# Patient Record
Sex: Male | Born: 1940 | Race: White | Hispanic: No | Marital: Married | Smoking: Never smoker
Health system: Southern US, Community
[De-identification: ages and names within clinical notes are randomized; demographics above are authoritative.]

## PROBLEM LIST (undated history)

## (undated) DIAGNOSIS — J181 Lobar pneumonia, unspecified organism: Secondary | ICD-10-CM

## (undated) DIAGNOSIS — E785 Hyperlipidemia, unspecified: Secondary | ICD-10-CM

## (undated) DIAGNOSIS — N179 Acute kidney failure, unspecified: Secondary | ICD-10-CM

## (undated) DIAGNOSIS — R0902 Hypoxemia: Secondary | ICD-10-CM

## (undated) DIAGNOSIS — Z79899 Other long term (current) drug therapy: Secondary | ICD-10-CM

## (undated) DIAGNOSIS — Z951 Presence of aortocoronary bypass graft: Secondary | ICD-10-CM

## (undated) DIAGNOSIS — I110 Hypertensive heart disease with heart failure: Secondary | ICD-10-CM

## (undated) DIAGNOSIS — I5032 Chronic diastolic (congestive) heart failure: Secondary | ICD-10-CM

## (undated) DIAGNOSIS — I10 Essential (primary) hypertension: Secondary | ICD-10-CM

## (undated) DIAGNOSIS — I2510 Atherosclerotic heart disease of native coronary artery without angina pectoris: Secondary | ICD-10-CM

## (undated) DIAGNOSIS — I4892 Unspecified atrial flutter: Secondary | ICD-10-CM

## (undated) DIAGNOSIS — E872 Acidosis: Secondary | ICD-10-CM

## (undated) DIAGNOSIS — J449 Chronic obstructive pulmonary disease, unspecified: Secondary | ICD-10-CM

## (undated) DIAGNOSIS — I480 Paroxysmal atrial fibrillation: Secondary | ICD-10-CM

## (undated) DIAGNOSIS — E119 Type 2 diabetes mellitus without complications: Secondary | ICD-10-CM

## (undated) HISTORY — DX: Paroxysmal atrial fibrillation: I48.0

## (undated) HISTORY — DX: Unspecified atrial flutter: I48.92

## (undated) HISTORY — DX: Acidosis: E87.2

## (undated) HISTORY — DX: Hypertensive heart disease with heart failure: I11.0

## (undated) HISTORY — DX: Atherosclerotic heart disease of native coronary artery without angina pectoris: I25.10

## (undated) HISTORY — DX: Acute kidney failure, unspecified: N17.9

## (undated) HISTORY — DX: Type 2 diabetes mellitus without complications: E11.9

## (undated) HISTORY — PX: ELECTROPHYSIOLOGIC STUDY: SHX172A

## (undated) HISTORY — DX: Chronic obstructive pulmonary disease, unspecified: J44.9

## (undated) HISTORY — PX: TOTAL HIP REVISION: SHX763

## (undated) HISTORY — PX: CORONARY ARTERY BYPASS GRAFT: SHX141

## (undated) HISTORY — DX: Lobar pneumonia, unspecified organism: J18.1

## (undated) HISTORY — DX: Presence of aortocoronary bypass graft: Z95.1

## (undated) HISTORY — DX: Other long term (current) drug therapy: Z79.899

## (undated) HISTORY — DX: Hypoxemia: R09.02

## (undated) HISTORY — PX: BACK SURGERY: SHX140

## (undated) HISTORY — DX: Chronic diastolic (congestive) heart failure: I50.32

## (undated) HISTORY — DX: Essential (primary) hypertension: I10

## (undated) HISTORY — DX: Hyperlipidemia, unspecified: E78.5

---

## 2008-03-19 ENCOUNTER — Ambulatory Visit: Payer: Self-pay | Admitting: Thoracic Surgery (Cardiothoracic Vascular Surgery)

## 2008-04-30 ENCOUNTER — Ambulatory Visit: Payer: Self-pay | Admitting: Thoracic Surgery (Cardiothoracic Vascular Surgery)

## 2014-03-21 ENCOUNTER — Other Ambulatory Visit: Payer: Self-pay | Admitting: Orthopaedic Surgery

## 2014-03-21 DIAGNOSIS — M545 Low back pain, unspecified: Secondary | ICD-10-CM

## 2014-03-26 ENCOUNTER — Ambulatory Visit
Admission: RE | Admit: 2014-03-26 | Discharge: 2014-03-26 | Disposition: A | Discharge status: Home / Self Care | Payer: Commercial Managed Care - HMO | Source: Ambulatory Visit | Attending: Orthopaedic Surgery | Admitting: Orthopaedic Surgery

## 2014-03-26 DIAGNOSIS — M545 Low back pain, unspecified: Secondary | ICD-10-CM

## 2014-11-20 DIAGNOSIS — E669 Obesity, unspecified: Secondary | ICD-10-CM | POA: Diagnosis not present

## 2014-11-20 DIAGNOSIS — E119 Type 2 diabetes mellitus without complications: Secondary | ICD-10-CM | POA: Diagnosis not present

## 2014-11-20 DIAGNOSIS — D509 Iron deficiency anemia, unspecified: Secondary | ICD-10-CM | POA: Diagnosis not present

## 2014-11-20 DIAGNOSIS — K219 Gastro-esophageal reflux disease without esophagitis: Secondary | ICD-10-CM | POA: Diagnosis not present

## 2014-11-20 DIAGNOSIS — M159 Polyosteoarthritis, unspecified: Secondary | ICD-10-CM | POA: Diagnosis not present

## 2014-11-20 DIAGNOSIS — G4700 Insomnia, unspecified: Secondary | ICD-10-CM | POA: Diagnosis not present

## 2014-11-20 DIAGNOSIS — M549 Dorsalgia, unspecified: Secondary | ICD-10-CM | POA: Diagnosis not present

## 2014-11-20 DIAGNOSIS — E780 Pure hypercholesterolemia: Secondary | ICD-10-CM | POA: Diagnosis not present

## 2015-02-20 DIAGNOSIS — I10 Essential (primary) hypertension: Secondary | ICD-10-CM | POA: Diagnosis not present

## 2015-02-20 DIAGNOSIS — M159 Polyosteoarthritis, unspecified: Secondary | ICD-10-CM | POA: Diagnosis not present

## 2015-02-20 DIAGNOSIS — D509 Iron deficiency anemia, unspecified: Secondary | ICD-10-CM | POA: Diagnosis not present

## 2015-02-20 DIAGNOSIS — M549 Dorsalgia, unspecified: Secondary | ICD-10-CM | POA: Diagnosis not present

## 2015-02-20 DIAGNOSIS — Z1389 Encounter for screening for other disorder: Secondary | ICD-10-CM | POA: Diagnosis not present

## 2015-02-20 DIAGNOSIS — E119 Type 2 diabetes mellitus without complications: Secondary | ICD-10-CM | POA: Diagnosis not present

## 2015-02-20 DIAGNOSIS — K219 Gastro-esophageal reflux disease without esophagitis: Secondary | ICD-10-CM | POA: Diagnosis not present

## 2015-02-20 DIAGNOSIS — G4700 Insomnia, unspecified: Secondary | ICD-10-CM | POA: Diagnosis not present

## 2015-02-20 DIAGNOSIS — Z9181 History of falling: Secondary | ICD-10-CM | POA: Diagnosis not present

## 2015-02-20 DIAGNOSIS — E780 Pure hypercholesterolemia: Secondary | ICD-10-CM | POA: Diagnosis not present

## 2015-04-15 DIAGNOSIS — I10 Essential (primary) hypertension: Secondary | ICD-10-CM | POA: Diagnosis not present

## 2015-05-13 DIAGNOSIS — I10 Essential (primary) hypertension: Secondary | ICD-10-CM | POA: Diagnosis not present

## 2015-05-22 DIAGNOSIS — E669 Obesity, unspecified: Secondary | ICD-10-CM | POA: Diagnosis not present

## 2015-05-22 DIAGNOSIS — E119 Type 2 diabetes mellitus without complications: Secondary | ICD-10-CM | POA: Diagnosis not present

## 2015-05-22 DIAGNOSIS — M549 Dorsalgia, unspecified: Secondary | ICD-10-CM | POA: Diagnosis not present

## 2015-05-22 DIAGNOSIS — I10 Essential (primary) hypertension: Secondary | ICD-10-CM | POA: Diagnosis not present

## 2015-05-22 DIAGNOSIS — D509 Iron deficiency anemia, unspecified: Secondary | ICD-10-CM | POA: Diagnosis not present

## 2015-05-22 DIAGNOSIS — E780 Pure hypercholesterolemia: Secondary | ICD-10-CM | POA: Diagnosis not present

## 2015-05-22 DIAGNOSIS — G4700 Insomnia, unspecified: Secondary | ICD-10-CM | POA: Diagnosis not present

## 2015-05-22 DIAGNOSIS — K219 Gastro-esophageal reflux disease without esophagitis: Secondary | ICD-10-CM | POA: Diagnosis not present

## 2015-05-22 DIAGNOSIS — M159 Polyosteoarthritis, unspecified: Secondary | ICD-10-CM | POA: Diagnosis not present

## 2015-08-13 DIAGNOSIS — Z23 Encounter for immunization: Secondary | ICD-10-CM | POA: Diagnosis not present

## 2015-08-13 DIAGNOSIS — I10 Essential (primary) hypertension: Secondary | ICD-10-CM | POA: Diagnosis not present

## 2015-08-24 DIAGNOSIS — E7800 Pure hypercholesterolemia, unspecified: Secondary | ICD-10-CM | POA: Diagnosis not present

## 2015-08-24 DIAGNOSIS — D509 Iron deficiency anemia, unspecified: Secondary | ICD-10-CM | POA: Diagnosis not present

## 2015-08-24 DIAGNOSIS — M549 Dorsalgia, unspecified: Secondary | ICD-10-CM | POA: Diagnosis not present

## 2015-08-24 DIAGNOSIS — E669 Obesity, unspecified: Secondary | ICD-10-CM | POA: Diagnosis not present

## 2015-08-24 DIAGNOSIS — E119 Type 2 diabetes mellitus without complications: Secondary | ICD-10-CM | POA: Diagnosis not present

## 2015-08-24 DIAGNOSIS — I10 Essential (primary) hypertension: Secondary | ICD-10-CM | POA: Diagnosis not present

## 2015-08-24 DIAGNOSIS — G4700 Insomnia, unspecified: Secondary | ICD-10-CM | POA: Diagnosis not present

## 2015-08-24 DIAGNOSIS — K219 Gastro-esophageal reflux disease without esophagitis: Secondary | ICD-10-CM | POA: Diagnosis not present

## 2015-08-24 DIAGNOSIS — M159 Polyosteoarthritis, unspecified: Secondary | ICD-10-CM | POA: Diagnosis not present

## 2015-08-24 DIAGNOSIS — I2510 Atherosclerotic heart disease of native coronary artery without angina pectoris: Secondary | ICD-10-CM | POA: Diagnosis not present

## 2015-12-01 DIAGNOSIS — K219 Gastro-esophageal reflux disease without esophagitis: Secondary | ICD-10-CM | POA: Diagnosis not present

## 2015-12-01 DIAGNOSIS — J208 Acute bronchitis due to other specified organisms: Secondary | ICD-10-CM | POA: Diagnosis not present

## 2015-12-01 DIAGNOSIS — M159 Polyosteoarthritis, unspecified: Secondary | ICD-10-CM | POA: Diagnosis not present

## 2015-12-01 DIAGNOSIS — E669 Obesity, unspecified: Secondary | ICD-10-CM | POA: Diagnosis not present

## 2015-12-01 DIAGNOSIS — G4700 Insomnia, unspecified: Secondary | ICD-10-CM | POA: Diagnosis not present

## 2015-12-01 DIAGNOSIS — E119 Type 2 diabetes mellitus without complications: Secondary | ICD-10-CM | POA: Diagnosis not present

## 2015-12-01 DIAGNOSIS — M549 Dorsalgia, unspecified: Secondary | ICD-10-CM | POA: Diagnosis not present

## 2015-12-01 DIAGNOSIS — D509 Iron deficiency anemia, unspecified: Secondary | ICD-10-CM | POA: Diagnosis not present

## 2015-12-01 DIAGNOSIS — J101 Influenza due to other identified influenza virus with other respiratory manifestations: Secondary | ICD-10-CM | POA: Diagnosis not present

## 2015-12-14 DIAGNOSIS — I10 Essential (primary) hypertension: Secondary | ICD-10-CM | POA: Diagnosis not present

## 2016-01-19 DIAGNOSIS — G4700 Insomnia, unspecified: Secondary | ICD-10-CM | POA: Diagnosis not present

## 2016-01-19 DIAGNOSIS — Z6834 Body mass index (BMI) 34.0-34.9, adult: Secondary | ICD-10-CM | POA: Diagnosis not present

## 2016-01-19 DIAGNOSIS — D509 Iron deficiency anemia, unspecified: Secondary | ICD-10-CM | POA: Diagnosis not present

## 2016-01-19 DIAGNOSIS — E119 Type 2 diabetes mellitus without complications: Secondary | ICD-10-CM | POA: Diagnosis not present

## 2016-01-19 DIAGNOSIS — J208 Acute bronchitis due to other specified organisms: Secondary | ICD-10-CM | POA: Diagnosis not present

## 2016-01-19 DIAGNOSIS — K219 Gastro-esophageal reflux disease without esophagitis: Secondary | ICD-10-CM | POA: Diagnosis not present

## 2016-01-19 DIAGNOSIS — M549 Dorsalgia, unspecified: Secondary | ICD-10-CM | POA: Diagnosis not present

## 2016-01-19 DIAGNOSIS — M159 Polyosteoarthritis, unspecified: Secondary | ICD-10-CM | POA: Diagnosis not present

## 2016-01-26 DIAGNOSIS — K219 Gastro-esophageal reflux disease without esophagitis: Secondary | ICD-10-CM | POA: Diagnosis not present

## 2016-01-26 DIAGNOSIS — E7800 Pure hypercholesterolemia, unspecified: Secondary | ICD-10-CM | POA: Diagnosis not present

## 2016-01-26 DIAGNOSIS — M159 Polyosteoarthritis, unspecified: Secondary | ICD-10-CM | POA: Diagnosis not present

## 2016-01-26 DIAGNOSIS — E669 Obesity, unspecified: Secondary | ICD-10-CM | POA: Diagnosis not present

## 2016-01-26 DIAGNOSIS — G4700 Insomnia, unspecified: Secondary | ICD-10-CM | POA: Diagnosis not present

## 2016-01-26 DIAGNOSIS — J0101 Acute recurrent maxillary sinusitis: Secondary | ICD-10-CM | POA: Diagnosis not present

## 2016-01-26 DIAGNOSIS — M549 Dorsalgia, unspecified: Secondary | ICD-10-CM | POA: Diagnosis not present

## 2016-01-26 DIAGNOSIS — D509 Iron deficiency anemia, unspecified: Secondary | ICD-10-CM | POA: Diagnosis not present

## 2016-01-26 DIAGNOSIS — E119 Type 2 diabetes mellitus without complications: Secondary | ICD-10-CM | POA: Diagnosis not present

## 2016-02-09 DIAGNOSIS — I2510 Atherosclerotic heart disease of native coronary artery without angina pectoris: Secondary | ICD-10-CM | POA: Diagnosis not present

## 2016-02-09 DIAGNOSIS — D509 Iron deficiency anemia, unspecified: Secondary | ICD-10-CM | POA: Diagnosis not present

## 2016-02-09 DIAGNOSIS — E669 Obesity, unspecified: Secondary | ICD-10-CM | POA: Diagnosis not present

## 2016-02-09 DIAGNOSIS — M159 Polyosteoarthritis, unspecified: Secondary | ICD-10-CM | POA: Diagnosis not present

## 2016-02-09 DIAGNOSIS — M549 Dorsalgia, unspecified: Secondary | ICD-10-CM | POA: Diagnosis not present

## 2016-02-09 DIAGNOSIS — E119 Type 2 diabetes mellitus without complications: Secondary | ICD-10-CM | POA: Diagnosis not present

## 2016-02-09 DIAGNOSIS — I10 Essential (primary) hypertension: Secondary | ICD-10-CM | POA: Diagnosis not present

## 2016-02-09 DIAGNOSIS — G4700 Insomnia, unspecified: Secondary | ICD-10-CM | POA: Diagnosis not present

## 2016-02-09 DIAGNOSIS — E7800 Pure hypercholesterolemia, unspecified: Secondary | ICD-10-CM | POA: Diagnosis not present

## 2016-02-09 DIAGNOSIS — K219 Gastro-esophageal reflux disease without esophagitis: Secondary | ICD-10-CM | POA: Diagnosis not present

## 2016-02-27 DIAGNOSIS — Z6833 Body mass index (BMI) 33.0-33.9, adult: Secondary | ICD-10-CM | POA: Diagnosis not present

## 2016-02-27 DIAGNOSIS — D509 Iron deficiency anemia, unspecified: Secondary | ICD-10-CM | POA: Diagnosis not present

## 2016-02-27 DIAGNOSIS — E119 Type 2 diabetes mellitus without complications: Secondary | ICD-10-CM | POA: Diagnosis not present

## 2016-02-27 DIAGNOSIS — K219 Gastro-esophageal reflux disease without esophagitis: Secondary | ICD-10-CM | POA: Diagnosis not present

## 2016-02-27 DIAGNOSIS — E669 Obesity, unspecified: Secondary | ICD-10-CM | POA: Diagnosis not present

## 2016-02-27 DIAGNOSIS — M549 Dorsalgia, unspecified: Secondary | ICD-10-CM | POA: Diagnosis not present

## 2016-02-27 DIAGNOSIS — G4700 Insomnia, unspecified: Secondary | ICD-10-CM | POA: Diagnosis not present

## 2016-02-27 DIAGNOSIS — M159 Polyosteoarthritis, unspecified: Secondary | ICD-10-CM | POA: Diagnosis not present

## 2016-04-12 DIAGNOSIS — I10 Essential (primary) hypertension: Secondary | ICD-10-CM | POA: Diagnosis not present

## 2016-04-19 DIAGNOSIS — I2510 Atherosclerotic heart disease of native coronary artery without angina pectoris: Secondary | ICD-10-CM

## 2016-04-19 DIAGNOSIS — I480 Paroxysmal atrial fibrillation: Secondary | ICD-10-CM

## 2016-04-19 DIAGNOSIS — I10 Essential (primary) hypertension: Secondary | ICD-10-CM

## 2016-04-19 DIAGNOSIS — I110 Hypertensive heart disease with heart failure: Secondary | ICD-10-CM

## 2016-04-19 DIAGNOSIS — Z951 Presence of aortocoronary bypass graft: Secondary | ICD-10-CM

## 2016-04-19 DIAGNOSIS — I5032 Chronic diastolic (congestive) heart failure: Secondary | ICD-10-CM

## 2016-04-19 DIAGNOSIS — Z79899 Other long term (current) drug therapy: Secondary | ICD-10-CM

## 2016-04-19 DIAGNOSIS — I25119 Atherosclerotic heart disease of native coronary artery with unspecified angina pectoris: Secondary | ICD-10-CM | POA: Insufficient documentation

## 2016-04-19 DIAGNOSIS — E785 Hyperlipidemia, unspecified: Secondary | ICD-10-CM

## 2016-04-19 HISTORY — DX: Paroxysmal atrial fibrillation: I48.0

## 2016-04-19 HISTORY — DX: Chronic diastolic (congestive) heart failure: I50.32

## 2016-04-19 HISTORY — DX: Presence of aortocoronary bypass graft: Z95.1

## 2016-04-19 HISTORY — DX: Hyperlipidemia, unspecified: E78.5

## 2016-04-19 HISTORY — DX: Atherosclerotic heart disease of native coronary artery without angina pectoris: I25.10

## 2016-04-19 HISTORY — DX: Hypertensive heart disease with heart failure: I11.0

## 2016-04-19 HISTORY — DX: Other long term (current) drug therapy: Z79.899

## 2016-04-19 HISTORY — DX: Essential (primary) hypertension: I10

## 2016-05-11 DIAGNOSIS — D509 Iron deficiency anemia, unspecified: Secondary | ICD-10-CM | POA: Diagnosis not present

## 2016-05-11 DIAGNOSIS — Z9181 History of falling: Secondary | ICD-10-CM | POA: Diagnosis not present

## 2016-05-11 DIAGNOSIS — Z1389 Encounter for screening for other disorder: Secondary | ICD-10-CM | POA: Diagnosis not present

## 2016-05-11 DIAGNOSIS — I2510 Atherosclerotic heart disease of native coronary artery without angina pectoris: Secondary | ICD-10-CM | POA: Diagnosis not present

## 2016-05-11 DIAGNOSIS — I10 Essential (primary) hypertension: Secondary | ICD-10-CM | POA: Diagnosis not present

## 2016-05-11 DIAGNOSIS — K219 Gastro-esophageal reflux disease without esophagitis: Secondary | ICD-10-CM | POA: Diagnosis not present

## 2016-05-11 DIAGNOSIS — G4700 Insomnia, unspecified: Secondary | ICD-10-CM | POA: Diagnosis not present

## 2016-05-11 DIAGNOSIS — M159 Polyosteoarthritis, unspecified: Secondary | ICD-10-CM | POA: Diagnosis not present

## 2016-05-11 DIAGNOSIS — M549 Dorsalgia, unspecified: Secondary | ICD-10-CM | POA: Diagnosis not present

## 2016-05-11 DIAGNOSIS — E7800 Pure hypercholesterolemia, unspecified: Secondary | ICD-10-CM | POA: Diagnosis not present

## 2016-05-11 DIAGNOSIS — E119 Type 2 diabetes mellitus without complications: Secondary | ICD-10-CM | POA: Diagnosis not present

## 2016-05-31 DIAGNOSIS — E785 Hyperlipidemia, unspecified: Secondary | ICD-10-CM | POA: Diagnosis not present

## 2016-05-31 DIAGNOSIS — I480 Paroxysmal atrial fibrillation: Secondary | ICD-10-CM | POA: Diagnosis not present

## 2016-05-31 DIAGNOSIS — Z79899 Other long term (current) drug therapy: Secondary | ICD-10-CM | POA: Diagnosis not present

## 2016-05-31 DIAGNOSIS — I5032 Chronic diastolic (congestive) heart failure: Secondary | ICD-10-CM | POA: Diagnosis not present

## 2016-05-31 DIAGNOSIS — I110 Hypertensive heart disease with heart failure: Secondary | ICD-10-CM | POA: Diagnosis not present

## 2016-06-02 DIAGNOSIS — I25119 Atherosclerotic heart disease of native coronary artery with unspecified angina pectoris: Secondary | ICD-10-CM | POA: Diagnosis not present

## 2016-06-24 DIAGNOSIS — G8929 Other chronic pain: Secondary | ICD-10-CM | POA: Diagnosis not present

## 2016-06-24 DIAGNOSIS — M5442 Lumbago with sciatica, left side: Secondary | ICD-10-CM | POA: Diagnosis not present

## 2016-06-28 ENCOUNTER — Other Ambulatory Visit: Payer: Self-pay | Admitting: Orthopedic Surgery

## 2016-06-28 DIAGNOSIS — M5442 Lumbago with sciatica, left side: Secondary | ICD-10-CM

## 2016-06-29 ENCOUNTER — Other Ambulatory Visit: Payer: Self-pay | Admitting: Orthopedic Surgery

## 2016-07-07 ENCOUNTER — Ambulatory Visit
Admission: RE | Admit: 2016-07-07 | Discharge: 2016-07-07 | Disposition: A | Discharge status: Home / Self Care | Payer: Commercial Managed Care - HMO | Source: Ambulatory Visit | Attending: Orthopedic Surgery | Admitting: Orthopedic Surgery

## 2016-07-07 ENCOUNTER — Other Ambulatory Visit: Payer: Self-pay | Admitting: Orthopedic Surgery

## 2016-07-07 DIAGNOSIS — M5442 Lumbago with sciatica, left side: Secondary | ICD-10-CM

## 2016-07-07 DIAGNOSIS — M4806 Spinal stenosis, lumbar region: Secondary | ICD-10-CM | POA: Diagnosis not present

## 2016-07-07 MED ORDER — DIAZEPAM 5 MG PO TABS
5.0000 mg | ORAL_TABLET | Freq: Once | ORAL | Status: AC
  Administered 2016-07-07: 5 mg via ORAL

## 2016-07-07 MED ORDER — IOPAMIDOL (ISOVUE-M 200) INJECTION 41%
18.0000 mL | Freq: Once | INTRAMUSCULAR | Status: AC
  Administered 2016-07-07: 18 mL via INTRATHECAL

## 2016-07-07 MED ORDER — ONDANSETRON HCL 4 MG/2ML IJ SOLN: 4.0000 mg | Freq: Four times a day (QID) | INTRAMUSCULAR | Status: DC | PRN

## 2016-07-07 NOTE — Discharge Instructions (Signed)

## 2016-07-08 ENCOUNTER — Telehealth: Payer: Self-pay

## 2016-07-08 NOTE — Telephone Encounter (Signed)
I spoke with patient to see how he was feeling after yesterday's myelogram.  He states he's doing great and is having no complications.  jkl

## 2016-07-26 DIAGNOSIS — M5442 Lumbago with sciatica, left side: Secondary | ICD-10-CM | POA: Diagnosis not present

## 2016-08-01 DIAGNOSIS — M5442 Lumbago with sciatica, left side: Secondary | ICD-10-CM | POA: Diagnosis not present

## 2016-08-03 DIAGNOSIS — M5442 Lumbago with sciatica, left side: Secondary | ICD-10-CM | POA: Diagnosis not present

## 2016-08-09 DIAGNOSIS — M5442 Lumbago with sciatica, left side: Secondary | ICD-10-CM | POA: Diagnosis not present

## 2016-08-11 DIAGNOSIS — M5442 Lumbago with sciatica, left side: Secondary | ICD-10-CM | POA: Diagnosis not present

## 2016-08-15 DIAGNOSIS — D509 Iron deficiency anemia, unspecified: Secondary | ICD-10-CM | POA: Diagnosis not present

## 2016-08-15 DIAGNOSIS — Z23 Encounter for immunization: Secondary | ICD-10-CM | POA: Diagnosis not present

## 2016-08-15 DIAGNOSIS — M549 Dorsalgia, unspecified: Secondary | ICD-10-CM | POA: Diagnosis not present

## 2016-08-15 DIAGNOSIS — I10 Essential (primary) hypertension: Secondary | ICD-10-CM | POA: Diagnosis not present

## 2016-08-15 DIAGNOSIS — M159 Polyosteoarthritis, unspecified: Secondary | ICD-10-CM | POA: Diagnosis not present

## 2016-08-15 DIAGNOSIS — G4700 Insomnia, unspecified: Secondary | ICD-10-CM | POA: Diagnosis not present

## 2016-08-15 DIAGNOSIS — E119 Type 2 diabetes mellitus without complications: Secondary | ICD-10-CM | POA: Diagnosis not present

## 2016-08-15 DIAGNOSIS — E669 Obesity, unspecified: Secondary | ICD-10-CM | POA: Diagnosis not present

## 2016-08-15 DIAGNOSIS — K219 Gastro-esophageal reflux disease without esophagitis: Secondary | ICD-10-CM | POA: Diagnosis not present

## 2016-08-16 DIAGNOSIS — M5442 Lumbago with sciatica, left side: Secondary | ICD-10-CM | POA: Diagnosis not present

## 2016-08-18 DIAGNOSIS — M5442 Lumbago with sciatica, left side: Secondary | ICD-10-CM | POA: Diagnosis not present

## 2016-08-20 DIAGNOSIS — R6520 Severe sepsis without septic shock: Secondary | ICD-10-CM | POA: Diagnosis not present

## 2016-08-20 DIAGNOSIS — R079 Chest pain, unspecified: Secondary | ICD-10-CM | POA: Diagnosis not present

## 2016-08-20 DIAGNOSIS — Z951 Presence of aortocoronary bypass graft: Secondary | ICD-10-CM | POA: Diagnosis not present

## 2016-08-20 DIAGNOSIS — R918 Other nonspecific abnormal finding of lung field: Secondary | ICD-10-CM | POA: Diagnosis not present

## 2016-08-20 DIAGNOSIS — Z96643 Presence of artificial hip joint, bilateral: Secondary | ICD-10-CM | POA: Diagnosis not present

## 2016-08-20 DIAGNOSIS — A419 Sepsis, unspecified organism: Secondary | ICD-10-CM | POA: Diagnosis not present

## 2016-08-20 DIAGNOSIS — I25119 Atherosclerotic heart disease of native coronary artery with unspecified angina pectoris: Secondary | ICD-10-CM | POA: Diagnosis not present

## 2016-08-20 DIAGNOSIS — J156 Pneumonia due to other aerobic Gram-negative bacteria: Secondary | ICD-10-CM | POA: Diagnosis not present

## 2016-08-20 DIAGNOSIS — R0902 Hypoxemia: Secondary | ICD-10-CM | POA: Diagnosis not present

## 2016-08-20 DIAGNOSIS — E872 Acidosis, unspecified: Secondary | ICD-10-CM

## 2016-08-20 DIAGNOSIS — R05 Cough: Secondary | ICD-10-CM | POA: Diagnosis not present

## 2016-08-20 DIAGNOSIS — I2510 Atherosclerotic heart disease of native coronary artery without angina pectoris: Secondary | ICD-10-CM | POA: Diagnosis not present

## 2016-08-20 DIAGNOSIS — J189 Pneumonia, unspecified organism: Secondary | ICD-10-CM

## 2016-08-20 DIAGNOSIS — R0602 Shortness of breath: Secondary | ICD-10-CM | POA: Diagnosis not present

## 2016-08-20 DIAGNOSIS — I10 Essential (primary) hypertension: Secondary | ICD-10-CM | POA: Diagnosis not present

## 2016-08-20 DIAGNOSIS — I4892 Unspecified atrial flutter: Secondary | ICD-10-CM | POA: Diagnosis not present

## 2016-08-20 DIAGNOSIS — I5032 Chronic diastolic (congestive) heart failure: Secondary | ICD-10-CM | POA: Diagnosis not present

## 2016-08-20 DIAGNOSIS — E119 Type 2 diabetes mellitus without complications: Secondary | ICD-10-CM | POA: Diagnosis not present

## 2016-08-20 DIAGNOSIS — I110 Hypertensive heart disease with heart failure: Secondary | ICD-10-CM | POA: Diagnosis not present

## 2016-08-20 DIAGNOSIS — J181 Lobar pneumonia, unspecified organism: Secondary | ICD-10-CM | POA: Diagnosis not present

## 2016-08-20 DIAGNOSIS — I493 Ventricular premature depolarization: Secondary | ICD-10-CM | POA: Diagnosis not present

## 2016-08-20 HISTORY — DX: Pneumonia, unspecified organism: J18.9

## 2016-08-20 HISTORY — DX: Acidosis, unspecified: E87.20

## 2016-08-20 HISTORY — DX: Acidosis: E87.2

## 2016-09-08 DIAGNOSIS — M159 Polyosteoarthritis, unspecified: Secondary | ICD-10-CM | POA: Diagnosis not present

## 2016-09-08 DIAGNOSIS — E119 Type 2 diabetes mellitus without complications: Secondary | ICD-10-CM | POA: Diagnosis not present

## 2016-09-08 DIAGNOSIS — D509 Iron deficiency anemia, unspecified: Secondary | ICD-10-CM | POA: Diagnosis not present

## 2016-09-08 DIAGNOSIS — K219 Gastro-esophageal reflux disease without esophagitis: Secondary | ICD-10-CM | POA: Diagnosis not present

## 2016-09-08 DIAGNOSIS — M549 Dorsalgia, unspecified: Secondary | ICD-10-CM | POA: Diagnosis not present

## 2016-09-08 DIAGNOSIS — B359 Dermatophytosis, unspecified: Secondary | ICD-10-CM | POA: Diagnosis not present

## 2016-09-08 DIAGNOSIS — G4700 Insomnia, unspecified: Secondary | ICD-10-CM | POA: Diagnosis not present

## 2016-09-08 DIAGNOSIS — E7800 Pure hypercholesterolemia, unspecified: Secondary | ICD-10-CM | POA: Diagnosis not present

## 2016-09-08 DIAGNOSIS — I10 Essential (primary) hypertension: Secondary | ICD-10-CM | POA: Diagnosis not present

## 2016-09-13 DIAGNOSIS — E669 Obesity, unspecified: Secondary | ICD-10-CM | POA: Diagnosis not present

## 2016-09-13 DIAGNOSIS — Z6835 Body mass index (BMI) 35.0-35.9, adult: Secondary | ICD-10-CM | POA: Diagnosis not present

## 2016-09-13 DIAGNOSIS — K219 Gastro-esophageal reflux disease without esophagitis: Secondary | ICD-10-CM | POA: Diagnosis not present

## 2016-09-13 DIAGNOSIS — M549 Dorsalgia, unspecified: Secondary | ICD-10-CM | POA: Diagnosis not present

## 2016-09-13 DIAGNOSIS — E119 Type 2 diabetes mellitus without complications: Secondary | ICD-10-CM | POA: Diagnosis not present

## 2016-09-13 DIAGNOSIS — G4700 Insomnia, unspecified: Secondary | ICD-10-CM | POA: Diagnosis not present

## 2016-09-13 DIAGNOSIS — D509 Iron deficiency anemia, unspecified: Secondary | ICD-10-CM | POA: Diagnosis not present

## 2016-09-13 DIAGNOSIS — M159 Polyosteoarthritis, unspecified: Secondary | ICD-10-CM | POA: Diagnosis not present

## 2016-09-20 DIAGNOSIS — M5442 Lumbago with sciatica, left side: Secondary | ICD-10-CM | POA: Diagnosis not present

## 2016-10-04 DIAGNOSIS — G8929 Other chronic pain: Secondary | ICD-10-CM | POA: Diagnosis not present

## 2016-10-04 DIAGNOSIS — M5442 Lumbago with sciatica, left side: Secondary | ICD-10-CM | POA: Diagnosis not present

## 2016-10-12 DIAGNOSIS — M159 Polyosteoarthritis, unspecified: Secondary | ICD-10-CM | POA: Diagnosis not present

## 2016-10-12 DIAGNOSIS — M549 Dorsalgia, unspecified: Secondary | ICD-10-CM | POA: Diagnosis not present

## 2016-10-12 DIAGNOSIS — E669 Obesity, unspecified: Secondary | ICD-10-CM | POA: Diagnosis not present

## 2016-10-12 DIAGNOSIS — G4700 Insomnia, unspecified: Secondary | ICD-10-CM | POA: Diagnosis not present

## 2016-10-12 DIAGNOSIS — K219 Gastro-esophageal reflux disease without esophagitis: Secondary | ICD-10-CM | POA: Diagnosis not present

## 2016-10-12 DIAGNOSIS — Z6836 Body mass index (BMI) 36.0-36.9, adult: Secondary | ICD-10-CM | POA: Diagnosis not present

## 2016-10-12 DIAGNOSIS — D509 Iron deficiency anemia, unspecified: Secondary | ICD-10-CM | POA: Diagnosis not present

## 2016-10-12 DIAGNOSIS — E119 Type 2 diabetes mellitus without complications: Secondary | ICD-10-CM | POA: Diagnosis not present

## 2016-11-01 DIAGNOSIS — G8929 Other chronic pain: Secondary | ICD-10-CM | POA: Diagnosis not present

## 2016-11-01 DIAGNOSIS — M5442 Lumbago with sciatica, left side: Secondary | ICD-10-CM | POA: Diagnosis not present

## 2016-11-15 DIAGNOSIS — M5442 Lumbago with sciatica, left side: Secondary | ICD-10-CM | POA: Diagnosis not present

## 2016-11-15 DIAGNOSIS — E119 Type 2 diabetes mellitus without complications: Secondary | ICD-10-CM | POA: Diagnosis not present

## 2016-11-15 DIAGNOSIS — D509 Iron deficiency anemia, unspecified: Secondary | ICD-10-CM | POA: Diagnosis not present

## 2016-11-15 DIAGNOSIS — R600 Localized edema: Secondary | ICD-10-CM | POA: Diagnosis not present

## 2016-11-15 DIAGNOSIS — M549 Dorsalgia, unspecified: Secondary | ICD-10-CM | POA: Diagnosis not present

## 2016-11-15 DIAGNOSIS — K219 Gastro-esophageal reflux disease without esophagitis: Secondary | ICD-10-CM | POA: Diagnosis not present

## 2016-11-15 DIAGNOSIS — G8929 Other chronic pain: Secondary | ICD-10-CM | POA: Diagnosis not present

## 2016-11-15 DIAGNOSIS — Z6837 Body mass index (BMI) 37.0-37.9, adult: Secondary | ICD-10-CM | POA: Diagnosis not present

## 2016-11-15 DIAGNOSIS — E669 Obesity, unspecified: Secondary | ICD-10-CM | POA: Diagnosis not present

## 2016-11-15 DIAGNOSIS — M159 Polyosteoarthritis, unspecified: Secondary | ICD-10-CM | POA: Diagnosis not present

## 2016-11-15 DIAGNOSIS — G4700 Insomnia, unspecified: Secondary | ICD-10-CM | POA: Diagnosis not present

## 2016-11-16 DIAGNOSIS — I4892 Unspecified atrial flutter: Secondary | ICD-10-CM | POA: Diagnosis not present

## 2016-11-16 DIAGNOSIS — R0902 Hypoxemia: Secondary | ICD-10-CM | POA: Diagnosis not present

## 2016-11-16 DIAGNOSIS — I5032 Chronic diastolic (congestive) heart failure: Secondary | ICD-10-CM | POA: Diagnosis not present

## 2016-11-16 DIAGNOSIS — I50810 Right heart failure, unspecified: Secondary | ICD-10-CM | POA: Diagnosis not present

## 2016-11-16 DIAGNOSIS — Z79899 Other long term (current) drug therapy: Secondary | ICD-10-CM | POA: Diagnosis not present

## 2016-11-16 DIAGNOSIS — I959 Hypotension, unspecified: Secondary | ICD-10-CM | POA: Diagnosis not present

## 2016-11-16 DIAGNOSIS — E669 Obesity, unspecified: Secondary | ICD-10-CM | POA: Diagnosis not present

## 2016-11-16 DIAGNOSIS — R0602 Shortness of breath: Secondary | ICD-10-CM | POA: Diagnosis not present

## 2016-11-16 DIAGNOSIS — Z951 Presence of aortocoronary bypass graft: Secondary | ICD-10-CM | POA: Diagnosis not present

## 2016-11-16 DIAGNOSIS — I10 Essential (primary) hypertension: Secondary | ICD-10-CM | POA: Diagnosis not present

## 2016-11-16 DIAGNOSIS — N179 Acute kidney failure, unspecified: Secondary | ICD-10-CM | POA: Diagnosis not present

## 2016-11-16 DIAGNOSIS — I484 Atypical atrial flutter: Secondary | ICD-10-CM | POA: Diagnosis not present

## 2016-11-16 DIAGNOSIS — R000 Tachycardia, unspecified: Secondary | ICD-10-CM | POA: Diagnosis not present

## 2016-11-16 DIAGNOSIS — I501 Left ventricular failure: Secondary | ICD-10-CM | POA: Diagnosis not present

## 2016-11-16 DIAGNOSIS — E118 Type 2 diabetes mellitus with unspecified complications: Secondary | ICD-10-CM | POA: Diagnosis not present

## 2016-11-16 DIAGNOSIS — I441 Atrioventricular block, second degree: Secondary | ICD-10-CM | POA: Diagnosis not present

## 2016-11-16 DIAGNOSIS — I081 Rheumatic disorders of both mitral and tricuspid valves: Secondary | ICD-10-CM | POA: Diagnosis not present

## 2016-11-16 DIAGNOSIS — E871 Hypo-osmolality and hyponatremia: Secondary | ICD-10-CM | POA: Diagnosis not present

## 2016-11-16 DIAGNOSIS — R001 Bradycardia, unspecified: Secondary | ICD-10-CM | POA: Diagnosis not present

## 2016-11-16 DIAGNOSIS — I4891 Unspecified atrial fibrillation: Secondary | ICD-10-CM | POA: Diagnosis not present

## 2016-11-16 DIAGNOSIS — I2510 Atherosclerotic heart disease of native coronary artery without angina pectoris: Secondary | ICD-10-CM | POA: Diagnosis not present

## 2016-11-16 DIAGNOSIS — I480 Paroxysmal atrial fibrillation: Secondary | ICD-10-CM | POA: Diagnosis not present

## 2016-11-16 DIAGNOSIS — D649 Anemia, unspecified: Secondary | ICD-10-CM | POA: Diagnosis not present

## 2016-11-16 DIAGNOSIS — I445 Left posterior fascicular block: Secondary | ICD-10-CM | POA: Diagnosis not present

## 2016-11-16 DIAGNOSIS — I483 Typical atrial flutter: Secondary | ICD-10-CM | POA: Diagnosis not present

## 2016-11-16 DIAGNOSIS — Z7901 Long term (current) use of anticoagulants: Secondary | ICD-10-CM | POA: Diagnosis not present

## 2016-11-16 DIAGNOSIS — N178 Other acute kidney failure: Secondary | ICD-10-CM | POA: Diagnosis not present

## 2016-11-16 DIAGNOSIS — I110 Hypertensive heart disease with heart failure: Secondary | ICD-10-CM | POA: Diagnosis not present

## 2016-11-16 DIAGNOSIS — I119 Hypertensive heart disease without heart failure: Secondary | ICD-10-CM | POA: Diagnosis not present

## 2016-11-16 DIAGNOSIS — E119 Type 2 diabetes mellitus without complications: Secondary | ICD-10-CM | POA: Diagnosis not present

## 2016-11-16 DIAGNOSIS — I509 Heart failure, unspecified: Secondary | ICD-10-CM | POA: Diagnosis not present

## 2016-11-16 DIAGNOSIS — I5030 Unspecified diastolic (congestive) heart failure: Secondary | ICD-10-CM | POA: Diagnosis not present

## 2016-11-16 DIAGNOSIS — I50812 Chronic right heart failure: Secondary | ICD-10-CM | POA: Diagnosis not present

## 2016-11-16 DIAGNOSIS — I130 Hypertensive heart and chronic kidney disease with heart failure and stage 1 through stage 4 chronic kidney disease, or unspecified chronic kidney disease: Secondary | ICD-10-CM | POA: Diagnosis not present

## 2016-11-16 DIAGNOSIS — N170 Acute kidney failure with tubular necrosis: Secondary | ICD-10-CM | POA: Diagnosis not present

## 2016-11-16 DIAGNOSIS — G4730 Sleep apnea, unspecified: Secondary | ICD-10-CM | POA: Diagnosis not present

## 2016-11-16 DIAGNOSIS — R600 Localized edema: Secondary | ICD-10-CM | POA: Diagnosis not present

## 2016-11-16 DIAGNOSIS — I5042 Chronic combined systolic (congestive) and diastolic (congestive) heart failure: Secondary | ICD-10-CM | POA: Diagnosis not present

## 2016-11-17 DIAGNOSIS — I4892 Unspecified atrial flutter: Secondary | ICD-10-CM

## 2016-11-17 HISTORY — DX: Unspecified atrial flutter: I48.92

## 2016-11-18 DIAGNOSIS — N183 Chronic kidney disease, stage 3 unspecified: Secondary | ICD-10-CM | POA: Insufficient documentation

## 2016-11-18 DIAGNOSIS — N179 Acute kidney failure, unspecified: Secondary | ICD-10-CM

## 2016-11-18 HISTORY — DX: Acute kidney failure, unspecified: N17.9

## 2016-11-21 DIAGNOSIS — R0902 Hypoxemia: Secondary | ICD-10-CM

## 2016-11-21 HISTORY — DX: Hypoxemia: R09.02

## 2016-11-25 DIAGNOSIS — E669 Obesity, unspecified: Secondary | ICD-10-CM | POA: Diagnosis not present

## 2016-11-25 DIAGNOSIS — M159 Polyosteoarthritis, unspecified: Secondary | ICD-10-CM | POA: Diagnosis not present

## 2016-11-25 DIAGNOSIS — K219 Gastro-esophageal reflux disease without esophagitis: Secondary | ICD-10-CM | POA: Diagnosis not present

## 2016-11-25 DIAGNOSIS — M549 Dorsalgia, unspecified: Secondary | ICD-10-CM | POA: Diagnosis not present

## 2016-11-25 DIAGNOSIS — R5381 Other malaise: Secondary | ICD-10-CM | POA: Diagnosis not present

## 2016-11-25 DIAGNOSIS — I4892 Unspecified atrial flutter: Secondary | ICD-10-CM | POA: Diagnosis not present

## 2016-11-25 DIAGNOSIS — I2510 Atherosclerotic heart disease of native coronary artery without angina pectoris: Secondary | ICD-10-CM | POA: Diagnosis not present

## 2016-11-25 DIAGNOSIS — D509 Iron deficiency anemia, unspecified: Secondary | ICD-10-CM | POA: Diagnosis not present

## 2016-11-25 DIAGNOSIS — G4700 Insomnia, unspecified: Secondary | ICD-10-CM | POA: Diagnosis not present

## 2016-12-02 DIAGNOSIS — I4892 Unspecified atrial flutter: Secondary | ICD-10-CM | POA: Diagnosis not present

## 2016-12-02 DIAGNOSIS — I5032 Chronic diastolic (congestive) heart failure: Secondary | ICD-10-CM | POA: Diagnosis not present

## 2016-12-02 DIAGNOSIS — M159 Polyosteoarthritis, unspecified: Secondary | ICD-10-CM | POA: Diagnosis not present

## 2016-12-02 DIAGNOSIS — G4700 Insomnia, unspecified: Secondary | ICD-10-CM | POA: Diagnosis not present

## 2016-12-02 DIAGNOSIS — D509 Iron deficiency anemia, unspecified: Secondary | ICD-10-CM | POA: Diagnosis not present

## 2016-12-02 DIAGNOSIS — M549 Dorsalgia, unspecified: Secondary | ICD-10-CM | POA: Diagnosis not present

## 2016-12-02 DIAGNOSIS — I2510 Atherosclerotic heart disease of native coronary artery without angina pectoris: Secondary | ICD-10-CM | POA: Diagnosis not present

## 2016-12-02 DIAGNOSIS — K219 Gastro-esophageal reflux disease without esophagitis: Secondary | ICD-10-CM | POA: Diagnosis not present

## 2016-12-02 DIAGNOSIS — E119 Type 2 diabetes mellitus without complications: Secondary | ICD-10-CM | POA: Diagnosis not present

## 2016-12-02 DIAGNOSIS — Z7901 Long term (current) use of anticoagulants: Secondary | ICD-10-CM | POA: Diagnosis not present

## 2016-12-02 DIAGNOSIS — Z79899 Other long term (current) drug therapy: Secondary | ICD-10-CM | POA: Diagnosis not present

## 2016-12-02 DIAGNOSIS — I110 Hypertensive heart disease with heart failure: Secondary | ICD-10-CM | POA: Diagnosis not present

## 2016-12-02 DIAGNOSIS — R7989 Other specified abnormal findings of blood chemistry: Secondary | ICD-10-CM | POA: Diagnosis not present

## 2016-12-02 DIAGNOSIS — R600 Localized edema: Secondary | ICD-10-CM | POA: Diagnosis not present

## 2016-12-15 DIAGNOSIS — I5032 Chronic diastolic (congestive) heart failure: Secondary | ICD-10-CM | POA: Diagnosis not present

## 2016-12-15 DIAGNOSIS — Z79899 Other long term (current) drug therapy: Secondary | ICD-10-CM | POA: Diagnosis not present

## 2016-12-15 DIAGNOSIS — I4892 Unspecified atrial flutter: Secondary | ICD-10-CM | POA: Diagnosis not present

## 2016-12-16 DIAGNOSIS — I4892 Unspecified atrial flutter: Secondary | ICD-10-CM | POA: Diagnosis not present

## 2016-12-16 DIAGNOSIS — D509 Iron deficiency anemia, unspecified: Secondary | ICD-10-CM | POA: Diagnosis not present

## 2016-12-16 DIAGNOSIS — K219 Gastro-esophageal reflux disease without esophagitis: Secondary | ICD-10-CM | POA: Diagnosis not present

## 2016-12-16 DIAGNOSIS — I10 Essential (primary) hypertension: Secondary | ICD-10-CM | POA: Diagnosis not present

## 2016-12-16 DIAGNOSIS — R7989 Other specified abnormal findings of blood chemistry: Secondary | ICD-10-CM | POA: Diagnosis not present

## 2016-12-16 DIAGNOSIS — G4700 Insomnia, unspecified: Secondary | ICD-10-CM | POA: Diagnosis not present

## 2016-12-16 DIAGNOSIS — E7800 Pure hypercholesterolemia, unspecified: Secondary | ICD-10-CM | POA: Diagnosis not present

## 2016-12-16 DIAGNOSIS — M159 Polyosteoarthritis, unspecified: Secondary | ICD-10-CM | POA: Diagnosis not present

## 2016-12-16 DIAGNOSIS — M549 Dorsalgia, unspecified: Secondary | ICD-10-CM | POA: Diagnosis not present

## 2016-12-16 DIAGNOSIS — E119 Type 2 diabetes mellitus without complications: Secondary | ICD-10-CM | POA: Diagnosis not present

## 2016-12-16 DIAGNOSIS — R600 Localized edema: Secondary | ICD-10-CM | POA: Diagnosis not present

## 2016-12-23 DIAGNOSIS — I5042 Chronic combined systolic (congestive) and diastolic (congestive) heart failure: Secondary | ICD-10-CM | POA: Diagnosis not present

## 2016-12-23 DIAGNOSIS — I5030 Unspecified diastolic (congestive) heart failure: Secondary | ICD-10-CM | POA: Diagnosis not present

## 2016-12-27 DIAGNOSIS — E784 Other hyperlipidemia: Secondary | ICD-10-CM | POA: Diagnosis not present

## 2016-12-27 DIAGNOSIS — Z951 Presence of aortocoronary bypass graft: Secondary | ICD-10-CM | POA: Diagnosis not present

## 2016-12-27 DIAGNOSIS — I4892 Unspecified atrial flutter: Secondary | ICD-10-CM | POA: Diagnosis not present

## 2016-12-27 DIAGNOSIS — I10 Essential (primary) hypertension: Secondary | ICD-10-CM | POA: Diagnosis not present

## 2016-12-27 DIAGNOSIS — I110 Hypertensive heart disease with heart failure: Secondary | ICD-10-CM | POA: Diagnosis not present

## 2016-12-28 DIAGNOSIS — Z79899 Other long term (current) drug therapy: Secondary | ICD-10-CM | POA: Diagnosis not present

## 2016-12-28 DIAGNOSIS — I5032 Chronic diastolic (congestive) heart failure: Secondary | ICD-10-CM | POA: Diagnosis not present

## 2016-12-28 DIAGNOSIS — I4892 Unspecified atrial flutter: Secondary | ICD-10-CM | POA: Diagnosis not present

## 2017-01-03 DIAGNOSIS — Z5309 Procedure and treatment not carried out because of other contraindication: Secondary | ICD-10-CM | POA: Diagnosis not present

## 2017-01-03 DIAGNOSIS — I480 Paroxysmal atrial fibrillation: Secondary | ICD-10-CM | POA: Diagnosis not present

## 2017-01-03 DIAGNOSIS — R9431 Abnormal electrocardiogram [ECG] [EKG]: Secondary | ICD-10-CM | POA: Diagnosis not present

## 2017-01-10 DIAGNOSIS — I110 Hypertensive heart disease with heart failure: Secondary | ICD-10-CM | POA: Diagnosis not present

## 2017-01-10 DIAGNOSIS — Z951 Presence of aortocoronary bypass graft: Secondary | ICD-10-CM | POA: Diagnosis not present

## 2017-01-10 DIAGNOSIS — Z79899 Other long term (current) drug therapy: Secondary | ICD-10-CM | POA: Diagnosis not present

## 2017-01-10 DIAGNOSIS — I4892 Unspecified atrial flutter: Secondary | ICD-10-CM | POA: Diagnosis not present

## 2017-01-10 DIAGNOSIS — Z7901 Long term (current) use of anticoagulants: Secondary | ICD-10-CM | POA: Diagnosis not present

## 2017-01-20 DIAGNOSIS — Z7901 Long term (current) use of anticoagulants: Secondary | ICD-10-CM | POA: Diagnosis not present

## 2017-01-20 DIAGNOSIS — Z79899 Other long term (current) drug therapy: Secondary | ICD-10-CM | POA: Diagnosis not present

## 2017-01-20 DIAGNOSIS — I5042 Chronic combined systolic (congestive) and diastolic (congestive) heart failure: Secondary | ICD-10-CM | POA: Diagnosis not present

## 2017-01-20 DIAGNOSIS — I4892 Unspecified atrial flutter: Secondary | ICD-10-CM | POA: Diagnosis not present

## 2017-01-20 DIAGNOSIS — I5030 Unspecified diastolic (congestive) heart failure: Secondary | ICD-10-CM | POA: Diagnosis not present

## 2017-01-20 DIAGNOSIS — I110 Hypertensive heart disease with heart failure: Secondary | ICD-10-CM | POA: Diagnosis not present

## 2017-02-28 DIAGNOSIS — I110 Hypertensive heart disease with heart failure: Secondary | ICD-10-CM | POA: Diagnosis not present

## 2017-02-28 DIAGNOSIS — Z951 Presence of aortocoronary bypass graft: Secondary | ICD-10-CM | POA: Diagnosis not present

## 2017-02-28 DIAGNOSIS — I2510 Atherosclerotic heart disease of native coronary artery without angina pectoris: Secondary | ICD-10-CM | POA: Diagnosis not present

## 2017-02-28 DIAGNOSIS — Z7901 Long term (current) use of anticoagulants: Secondary | ICD-10-CM | POA: Diagnosis not present

## 2017-02-28 DIAGNOSIS — I4892 Unspecified atrial flutter: Secondary | ICD-10-CM | POA: Diagnosis not present

## 2017-02-28 DIAGNOSIS — Z79899 Other long term (current) drug therapy: Secondary | ICD-10-CM | POA: Diagnosis not present

## 2017-03-03 DIAGNOSIS — Z7901 Long term (current) use of anticoagulants: Secondary | ICD-10-CM | POA: Diagnosis not present

## 2017-03-03 DIAGNOSIS — I4892 Unspecified atrial flutter: Secondary | ICD-10-CM | POA: Diagnosis not present

## 2017-03-03 DIAGNOSIS — Z79899 Other long term (current) drug therapy: Secondary | ICD-10-CM | POA: Diagnosis not present

## 2017-03-03 DIAGNOSIS — I2510 Atherosclerotic heart disease of native coronary artery without angina pectoris: Secondary | ICD-10-CM | POA: Diagnosis not present

## 2017-03-03 DIAGNOSIS — I110 Hypertensive heart disease with heart failure: Secondary | ICD-10-CM | POA: Diagnosis not present

## 2017-03-03 DIAGNOSIS — I5032 Chronic diastolic (congestive) heart failure: Secondary | ICD-10-CM | POA: Diagnosis not present

## 2017-04-13 DIAGNOSIS — D1801 Hemangioma of skin and subcutaneous tissue: Secondary | ICD-10-CM | POA: Diagnosis not present

## 2017-04-13 DIAGNOSIS — L821 Other seborrheic keratosis: Secondary | ICD-10-CM | POA: Diagnosis not present

## 2017-04-13 DIAGNOSIS — C44319 Basal cell carcinoma of skin of other parts of face: Secondary | ICD-10-CM | POA: Diagnosis not present

## 2017-04-13 DIAGNOSIS — L820 Inflamed seborrheic keratosis: Secondary | ICD-10-CM | POA: Diagnosis not present

## 2017-04-17 DIAGNOSIS — E7800 Pure hypercholesterolemia, unspecified: Secondary | ICD-10-CM | POA: Diagnosis not present

## 2017-04-17 DIAGNOSIS — D509 Iron deficiency anemia, unspecified: Secondary | ICD-10-CM | POA: Diagnosis not present

## 2017-04-17 DIAGNOSIS — I10 Essential (primary) hypertension: Secondary | ICD-10-CM | POA: Diagnosis not present

## 2017-04-17 DIAGNOSIS — M549 Dorsalgia, unspecified: Secondary | ICD-10-CM | POA: Diagnosis not present

## 2017-04-17 DIAGNOSIS — E119 Type 2 diabetes mellitus without complications: Secondary | ICD-10-CM | POA: Diagnosis not present

## 2017-04-17 DIAGNOSIS — M159 Polyosteoarthritis, unspecified: Secondary | ICD-10-CM | POA: Diagnosis not present

## 2017-04-17 DIAGNOSIS — G4700 Insomnia, unspecified: Secondary | ICD-10-CM | POA: Diagnosis not present

## 2017-04-17 DIAGNOSIS — K219 Gastro-esophageal reflux disease without esophagitis: Secondary | ICD-10-CM | POA: Diagnosis not present

## 2017-04-17 DIAGNOSIS — R600 Localized edema: Secondary | ICD-10-CM | POA: Diagnosis not present

## 2017-04-17 DIAGNOSIS — I4892 Unspecified atrial flutter: Secondary | ICD-10-CM | POA: Diagnosis not present

## 2017-05-24 DIAGNOSIS — I5032 Chronic diastolic (congestive) heart failure: Secondary | ICD-10-CM | POA: Diagnosis not present

## 2017-05-24 DIAGNOSIS — Z79899 Other long term (current) drug therapy: Secondary | ICD-10-CM | POA: Diagnosis not present

## 2017-07-03 ENCOUNTER — Ambulatory Visit: Payer: Commercial Managed Care - HMO | Admitting: Cardiology

## 2017-07-24 DIAGNOSIS — I509 Heart failure, unspecified: Secondary | ICD-10-CM | POA: Diagnosis not present

## 2017-07-25 DIAGNOSIS — J449 Chronic obstructive pulmonary disease, unspecified: Secondary | ICD-10-CM | POA: Insufficient documentation

## 2017-07-25 DIAGNOSIS — E119 Type 2 diabetes mellitus without complications: Secondary | ICD-10-CM

## 2017-07-25 HISTORY — DX: Chronic obstructive pulmonary disease, unspecified: J44.9

## 2017-07-25 HISTORY — DX: Type 2 diabetes mellitus without complications: E11.9

## 2017-07-26 DIAGNOSIS — I25810 Atherosclerosis of coronary artery bypass graft(s) without angina pectoris: Secondary | ICD-10-CM | POA: Diagnosis not present

## 2017-07-26 DIAGNOSIS — I509 Heart failure, unspecified: Secondary | ICD-10-CM | POA: Diagnosis not present

## 2017-07-26 DIAGNOSIS — J449 Chronic obstructive pulmonary disease, unspecified: Secondary | ICD-10-CM | POA: Diagnosis not present

## 2017-07-26 DIAGNOSIS — I4892 Unspecified atrial flutter: Secondary | ICD-10-CM | POA: Diagnosis not present

## 2017-08-09 DIAGNOSIS — Z1389 Encounter for screening for other disorder: Secondary | ICD-10-CM | POA: Diagnosis not present

## 2017-08-09 DIAGNOSIS — R945 Abnormal results of liver function studies: Secondary | ICD-10-CM | POA: Diagnosis not present

## 2017-08-09 DIAGNOSIS — D509 Iron deficiency anemia, unspecified: Secondary | ICD-10-CM | POA: Diagnosis not present

## 2017-08-09 DIAGNOSIS — E7800 Pure hypercholesterolemia, unspecified: Secondary | ICD-10-CM | POA: Diagnosis not present

## 2017-08-09 DIAGNOSIS — Z23 Encounter for immunization: Secondary | ICD-10-CM | POA: Diagnosis not present

## 2017-08-09 DIAGNOSIS — Z9181 History of falling: Secondary | ICD-10-CM | POA: Diagnosis not present

## 2017-08-09 DIAGNOSIS — I10 Essential (primary) hypertension: Secondary | ICD-10-CM | POA: Diagnosis not present

## 2017-08-09 DIAGNOSIS — E119 Type 2 diabetes mellitus without complications: Secondary | ICD-10-CM | POA: Diagnosis not present

## 2017-08-09 DIAGNOSIS — I4892 Unspecified atrial flutter: Secondary | ICD-10-CM | POA: Diagnosis not present

## 2017-08-09 DIAGNOSIS — M159 Polyosteoarthritis, unspecified: Secondary | ICD-10-CM | POA: Diagnosis not present

## 2017-08-09 DIAGNOSIS — K219 Gastro-esophageal reflux disease without esophagitis: Secondary | ICD-10-CM | POA: Diagnosis not present

## 2017-09-26 ENCOUNTER — Other Ambulatory Visit: Payer: Self-pay | Admitting: Cardiology

## 2017-09-27 DIAGNOSIS — M47896 Other spondylosis, lumbar region: Secondary | ICD-10-CM | POA: Diagnosis not present

## 2017-09-27 DIAGNOSIS — M545 Low back pain: Secondary | ICD-10-CM | POA: Diagnosis not present

## 2017-09-27 DIAGNOSIS — M5136 Other intervertebral disc degeneration, lumbar region: Secondary | ICD-10-CM | POA: Diagnosis not present

## 2017-09-29 DIAGNOSIS — M47816 Spondylosis without myelopathy or radiculopathy, lumbar region: Secondary | ICD-10-CM | POA: Diagnosis not present

## 2017-09-29 DIAGNOSIS — M47895 Other spondylosis, thoracolumbar region: Secondary | ICD-10-CM | POA: Diagnosis not present

## 2017-09-29 DIAGNOSIS — Z6833 Body mass index (BMI) 33.0-33.9, adult: Secondary | ICD-10-CM | POA: Diagnosis not present

## 2017-09-29 DIAGNOSIS — Z4689 Encounter for fitting and adjustment of other specified devices: Secondary | ICD-10-CM | POA: Diagnosis not present

## 2017-09-29 DIAGNOSIS — M5136 Other intervertebral disc degeneration, lumbar region: Secondary | ICD-10-CM | POA: Diagnosis not present

## 2017-09-29 DIAGNOSIS — M545 Low back pain: Secondary | ICD-10-CM | POA: Diagnosis not present

## 2017-10-17 DIAGNOSIS — M47816 Spondylosis without myelopathy or radiculopathy, lumbar region: Secondary | ICD-10-CM | POA: Diagnosis not present

## 2017-10-31 DIAGNOSIS — J431 Panlobular emphysema: Secondary | ICD-10-CM | POA: Diagnosis not present

## 2017-10-31 DIAGNOSIS — I483 Typical atrial flutter: Secondary | ICD-10-CM | POA: Diagnosis not present

## 2017-10-31 DIAGNOSIS — E0800 Diabetes mellitus due to underlying condition with hyperosmolarity without nonketotic hyperglycemic-hyperosmolar coma (NKHHC): Secondary | ICD-10-CM | POA: Diagnosis not present

## 2017-10-31 DIAGNOSIS — I2510 Atherosclerotic heart disease of native coronary artery without angina pectoris: Secondary | ICD-10-CM | POA: Diagnosis not present

## 2017-11-21 DIAGNOSIS — M47816 Spondylosis without myelopathy or radiculopathy, lumbar region: Secondary | ICD-10-CM | POA: Diagnosis not present

## 2017-11-28 DIAGNOSIS — K219 Gastro-esophageal reflux disease without esophagitis: Secondary | ICD-10-CM | POA: Diagnosis not present

## 2017-11-28 DIAGNOSIS — M549 Dorsalgia, unspecified: Secondary | ICD-10-CM | POA: Diagnosis not present

## 2017-11-28 DIAGNOSIS — G4700 Insomnia, unspecified: Secondary | ICD-10-CM | POA: Diagnosis not present

## 2017-11-28 DIAGNOSIS — I4892 Unspecified atrial flutter: Secondary | ICD-10-CM | POA: Diagnosis not present

## 2017-11-28 DIAGNOSIS — D509 Iron deficiency anemia, unspecified: Secondary | ICD-10-CM | POA: Diagnosis not present

## 2017-11-28 DIAGNOSIS — M159 Polyosteoarthritis, unspecified: Secondary | ICD-10-CM | POA: Diagnosis not present

## 2017-11-28 DIAGNOSIS — R600 Localized edema: Secondary | ICD-10-CM | POA: Diagnosis not present

## 2017-11-28 DIAGNOSIS — E119 Type 2 diabetes mellitus without complications: Secondary | ICD-10-CM | POA: Diagnosis not present

## 2017-11-28 DIAGNOSIS — I10 Essential (primary) hypertension: Secondary | ICD-10-CM | POA: Diagnosis not present

## 2017-11-28 DIAGNOSIS — E7800 Pure hypercholesterolemia, unspecified: Secondary | ICD-10-CM | POA: Diagnosis not present

## 2017-11-28 DIAGNOSIS — R945 Abnormal results of liver function studies: Secondary | ICD-10-CM | POA: Diagnosis not present

## 2017-11-29 DIAGNOSIS — E785 Hyperlipidemia, unspecified: Secondary | ICD-10-CM | POA: Diagnosis not present

## 2017-11-29 DIAGNOSIS — E669 Obesity, unspecified: Secondary | ICD-10-CM | POA: Diagnosis not present

## 2017-11-29 DIAGNOSIS — Z0000 Encounter for general adult medical examination without abnormal findings: Secondary | ICD-10-CM | POA: Diagnosis not present

## 2017-11-29 DIAGNOSIS — Z139 Encounter for screening, unspecified: Secondary | ICD-10-CM | POA: Diagnosis not present

## 2017-11-29 DIAGNOSIS — Z6836 Body mass index (BMI) 36.0-36.9, adult: Secondary | ICD-10-CM | POA: Diagnosis not present

## 2017-11-29 DIAGNOSIS — Z125 Encounter for screening for malignant neoplasm of prostate: Secondary | ICD-10-CM | POA: Diagnosis not present

## 2017-11-29 DIAGNOSIS — Z9181 History of falling: Secondary | ICD-10-CM | POA: Diagnosis not present

## 2017-11-29 DIAGNOSIS — Z1331 Encounter for screening for depression: Secondary | ICD-10-CM | POA: Diagnosis not present

## 2018-01-09 DIAGNOSIS — M47816 Spondylosis without myelopathy or radiculopathy, lumbar region: Secondary | ICD-10-CM | POA: Diagnosis not present

## 2018-01-29 DIAGNOSIS — E785 Hyperlipidemia, unspecified: Secondary | ICD-10-CM | POA: Insufficient documentation

## 2018-01-29 DIAGNOSIS — I10 Essential (primary) hypertension: Secondary | ICD-10-CM | POA: Diagnosis not present

## 2018-01-29 DIAGNOSIS — Z79899 Other long term (current) drug therapy: Secondary | ICD-10-CM | POA: Diagnosis not present

## 2018-01-29 DIAGNOSIS — I2510 Atherosclerotic heart disease of native coronary artery without angina pectoris: Secondary | ICD-10-CM | POA: Diagnosis not present

## 2018-01-29 DIAGNOSIS — I484 Atypical atrial flutter: Secondary | ICD-10-CM | POA: Diagnosis not present

## 2018-01-29 HISTORY — DX: Hyperlipidemia, unspecified: E78.5

## 2018-02-08 DIAGNOSIS — M47896 Other spondylosis, lumbar region: Secondary | ICD-10-CM | POA: Diagnosis not present

## 2018-02-08 DIAGNOSIS — M47816 Spondylosis without myelopathy or radiculopathy, lumbar region: Secondary | ICD-10-CM | POA: Diagnosis not present

## 2018-03-26 DIAGNOSIS — E119 Type 2 diabetes mellitus without complications: Secondary | ICD-10-CM | POA: Diagnosis not present

## 2018-03-26 DIAGNOSIS — R600 Localized edema: Secondary | ICD-10-CM | POA: Diagnosis not present

## 2018-03-26 DIAGNOSIS — M549 Dorsalgia, unspecified: Secondary | ICD-10-CM | POA: Diagnosis not present

## 2018-03-26 DIAGNOSIS — D509 Iron deficiency anemia, unspecified: Secondary | ICD-10-CM | POA: Diagnosis not present

## 2018-03-26 DIAGNOSIS — E669 Obesity, unspecified: Secondary | ICD-10-CM | POA: Diagnosis not present

## 2018-03-26 DIAGNOSIS — E7800 Pure hypercholesterolemia, unspecified: Secondary | ICD-10-CM | POA: Diagnosis not present

## 2018-03-26 DIAGNOSIS — R945 Abnormal results of liver function studies: Secondary | ICD-10-CM | POA: Diagnosis not present

## 2018-03-26 DIAGNOSIS — K219 Gastro-esophageal reflux disease without esophagitis: Secondary | ICD-10-CM | POA: Diagnosis not present

## 2018-03-26 DIAGNOSIS — G4700 Insomnia, unspecified: Secondary | ICD-10-CM | POA: Diagnosis not present

## 2018-03-26 DIAGNOSIS — I10 Essential (primary) hypertension: Secondary | ICD-10-CM | POA: Diagnosis not present

## 2018-03-26 DIAGNOSIS — M159 Polyosteoarthritis, unspecified: Secondary | ICD-10-CM | POA: Diagnosis not present

## 2018-03-26 DIAGNOSIS — I4892 Unspecified atrial flutter: Secondary | ICD-10-CM | POA: Diagnosis not present

## 2018-06-14 ENCOUNTER — Other Ambulatory Visit: Payer: Self-pay | Admitting: Cardiology

## 2018-06-19 NOTE — Progress Notes (Signed)
Cardiology Office Note:    Date:  06/20/2018   ID:  Antonio Snyder, DOB 04-10-1941, MRN [id number]  PCP:  Cher Nakai, MD  Cardiologist:  Shirlee More, MD    Referring MD: Cher Nakai, MD    ASSESSMENT:    1. Chronic diastolic heart failure ([place])   2. Hypertensive heart disease with heart failure ([place])   3. On amiodarone therapy   4. Sinoatrial bradycardia    PLAN:    In order of problems listed above:  1. He is symptomatic New York Heart Association class II to class III decompensated with marked fluid overload with his congestive heart failure.  Unfortunately no longer weighs himself at home daily.  He will double his diuretic check baseline renal function and proBNP level and reassess back in the office in 4 to 6 weeks.  See above 2. Continue his current antihypertensive metoprolol 3. Continue low-dose amiodarone check liver function as well as TSH for toxicity he appears hypothyroid.  Check chest x-ray to screen for pulmonary toxicity 4. Reduce beta-blocker dose for   Next appointment: 4 to 6 weeks   Medication Adjustments/Labs and Tests Ordered: Current medicines are reviewed at length with the patient today.  Concerns regarding medicines are outlined above.  Orders Placed This Encounter  Procedures  . Comp Met (CMET)  . TSH  . Pro b natriuretic peptide (BNP)  . EKG 12-Lead   Meds ordered this encounter  Medications  . metoprolol tartrate (LOPRESSOR) 100 MG tablet    Sig: Take 0.5 tablets (50 mg total) by mouth daily.    Dispense:  30 tablet    Refill:  3    Chief Complaint  Patient presents with  . Follow-up  . Coronary Artery Disease  . Congestive Heart Failure  . Atrial Fibrillation  . Atrial Flutter  . Anticoagulation    History of Present Illness:    Antonio Snyder is a 77 y.o. male with a hx of CAD CABG PAF on amiodarone dyslipidemia atrial flutter and heart flutter last seen by me 03/03/17. Compliance with diet, lifestyle and medications: No he no  longer weighs daily  He does not feel well he is fatigued he is intolerant of hot and cold weather and he has marked peripheral edema with decompensated heart failure and short of breath with activity.  No orthopnea PND chest pain palpitation or syncope.  He is at risk for amiodarone thyroid dysfunction and needs screening test for liver function and pulmonary toxicity.  He has had no recurrence of atrial fibrillation and is not anticoagulated at this time Past Medical History:  Diagnosis Date  . AKI (acute kidney injury) ([place]) 11/18/2016  . Atrial flutter ([place]) 11/17/2016  . CAD in native artery 04/19/2016   03/2008 CABG LIMA to LAD, SVG DIAG, OM1, OM2  . Chronic diastolic heart failure ([place]) 04/19/2016  . COPD (chronic obstructive pulmonary disease) ([place]) 07/25/2017  . DM (diabetes mellitus) ([place]) 07/25/2017  . Dyslipidemia 01/29/2018  . Essential hypertension 04/19/2016  . 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 Hyperlipidemia 04/19/2016  . Hypertensive heart disease with heart failure ([place]) 04/19/2016  . Hypoxemia 11/21/2016  . Lactic acidosis 08/20/2016  . Left upper lobe pneumonia ([place]) 08/20/2016  . On amiodarone therapy 04/19/2016  . PAF (paroxysmal atrial fibrillation) ([place]) 04/19/2016    Past Surgical History:  Procedure Laterality Date  . BACK SURGERY    . CORONARY ARTERY BYPASS GRAFT    . ELECTROPHYSIOLOGIC STUDY  atrial tach/flutter  . TOTAL HIP REVISION      Current Medications: Current Meds  Medication Sig  . amiodarone (PACERONE) 200 MG tablet Take 200 mg by mouth daily.  . Ascorbic Acid (VITAMIN C) 1000 MG tablet Take 1,000 mg by mouth daily.  Marland Kitchen aspirin EC 81 MG tablet Take 81 mg by mouth daily.  Marland Kitchen atorvastatin (LIPITOR) 40 MG tablet Take 40 mg by mouth daily.  Marland Kitchen glimepiride (AMARYL) 4 MG tablet Take 4 mg by mouth daily.  . metFORMIN (GLUCOPHAGE) 850 MG tablet Take 850 mg by mouth daily.  . metoprolol tartrate (LOPRESSOR) 100 MG tablet Take 0.5 tablets (50 mg total) by mouth  daily.  . pantoprazole (PROTONIX) 40 MG tablet Take 40 mg by mouth daily.  Marland Kitchen torsemide (DEMADEX) 20 MG tablet Take 20 mg by mouth 2 (two) times daily.  . [DISCONTINUED] metoprolol tartrate (LOPRESSOR) 100 MG tablet Take 50 mg by mouth 2 (two) times daily.     Allergies:   Patient has no known allergies.   Social History   Socioeconomic History  . Marital status: Married    Spouse name: Not on file  . Number of children: Not on file  . Years of education: Not on file  . Highest education level: Not on file  Occupational History  . Not on file  Social Needs  . Financial resource strain: Not on file  . Food insecurity:    Worry: Not on file    Inability: Not on file  . Transportation needs:    Medical: Not on file    Non-medical: Not on file  Tobacco Use  . Smoking status: Never Smoker  . Smokeless tobacco: Never Used  Substance and Sexual Activity  . Alcohol use: Never    Frequency: Never  . Drug use: Never  . Sexual activity: Not on file  Lifestyle  . Physical activity:    Days per week: Not on file    Minutes per session: Not on file  . Stress: Not on file  Relationships  . Social connections:    Talks on phone: Not on file    Gets together: Not on file    Attends religious service: Not on file    Active member of club or organization: Not on file    Attends meetings of clubs or organizations: Not on file    Relationship status: Not on file  Other Topics Concern  . Not on file  Social History Narrative  . Not on file     Family History: The patient's family history includes Congestive Heart Failure in his mother; Heart attack in his father. ROS:   Please see the history of present illness.    All other systems reviewed and are negative.  EKGs/Labs/Other Studies Reviewed:    The following studies were reviewed today:  EKG:  EKG ordered today.  The ekg ordered today demonstrates low voltage P waves sinus bradycardia 51 bpm  Recent Labs: Requested from  his PCP office No results found for requested labs within last 8760 hours.  Recent Lipid Panel No results found for: CHOL, TRIG, HDL, CHOLHDL, VLDL, LDLCALC, LDLDIRECT  Physical Exam:    VS:  BP 132/68 (BP Location: Left Arm, Patient Position: Sitting, Cuff Size: Normal)   Pulse (!) 52   Ht 5' 4"  (1.626 m)   Wt 202 lb (91.6 kg)   SpO2 93%   BMI 34.67 kg/m     Wt Readings from Last 3 Encounters:  06/20/18 202  lb (91.6 kg)     GEN: [place] Well nourished, well developed in no acute distress HEENT: Normal NECK: No JVD; No carotid bruits LYMPHATICS: No lymphadenopathy CARDIAC: soft S1RRR, no murmurs, rubs, gallops RESPIRATORY:  Clear to auscultation without rales, wheezing or rhonchi  ABDOMEN: Soft, non-tender, non-distended MUSCULOSKELETAL:  4+ to the knee bilateral edema; No deformity  SKIN: Warm and dry NEUROLOGIC:  Alert and oriented x 3 PSYCHIATRIC:  Normal affect    Signed, Shirlee More, MD  06/20/2018 2:48 PM    [facility]

## 2018-06-20 ENCOUNTER — Ambulatory Visit: Payer: Medicare HMO | Admitting: Cardiology

## 2018-06-20 ENCOUNTER — Encounter: Payer: Self-pay | Admitting: Cardiology

## 2018-06-20 VITALS — BP 132/68 | HR 52 | Ht 64.0 in | Wt 202.0 lb

## 2018-06-20 DIAGNOSIS — Z79899 Other long term (current) drug therapy: Secondary | ICD-10-CM | POA: Diagnosis not present

## 2018-06-20 DIAGNOSIS — R001 Bradycardia, unspecified: Secondary | ICD-10-CM | POA: Diagnosis not present

## 2018-06-20 DIAGNOSIS — I110 Hypertensive heart disease with heart failure: Secondary | ICD-10-CM | POA: Diagnosis not present

## 2018-06-20 DIAGNOSIS — I517 Cardiomegaly: Secondary | ICD-10-CM | POA: Diagnosis not present

## 2018-06-20 DIAGNOSIS — I5032 Chronic diastolic (congestive) heart failure: Secondary | ICD-10-CM | POA: Diagnosis not present

## 2018-06-20 MED ORDER — AMIODARONE HCL 200 MG PO TABS: 200.0000 mg | ORAL_TABLET | Freq: Every day | ORAL | 1 refills | Status: DC

## 2018-06-20 MED ORDER — METOPROLOL TARTRATE 100 MG PO TABS: 50.0000 mg | ORAL_TABLET | Freq: Every day | ORAL | 3 refills | Status: DC

## 2018-06-20 MED ORDER — TORSEMIDE 20 MG PO TABS: 20.0000 mg | ORAL_TABLET | Freq: Two times a day (BID) | ORAL | 1 refills | Status: DC

## 2018-06-20 NOTE — Patient Instructions (Addendum)
Medication Instructions:  Your physician has recommended you make the following change in your medication:    INCREASE: Torsemide to 20 mg twice daily. DECREASE: Metoprolol to 50 mg daily.   Labwork: Your physician recommends that you return for lab work today: CMP, TSH, and Pro BNP.    Testing/Procedures: A chest x-ray takes a picture of the organs and structures inside the chest, including the heart, lungs, and blood vessels. This test can show several things, including, whether the heart is enlarges; whether fluid is building up in the lungs; and whether pacemaker / defibrillator leads are still in place.   Follow-Up: Your physician recommends that you schedule a follow-up appointment in: 6weeks.   Any Other Special Instructions Will Be Listed Below (If Applicable).  Please weigh daily.      If you need a refill on your cardiac medications before your next appointment, please call your pharmacy.     Heart Failure  Weigh yourself every morning when you first wake up and record on a calender or note pad, bring this to your office visits. Using a pill tender can help with taking your medications consistently.  Limit your fluid intake to 2 liters daily  Limit your sodium intake to less than 2-3 grams daily. Ask if you need dietary teaching.  If you gain more than 3 pounds (from your dry weight ), double your dose of diuretic for the day.  If you gain more than 5 pounds (from your dry weight), double your dose of lasix and call your heart failure doctor.  Please do not smoke tobacco since it is very bad for your heart.  Please do not drink alcohol since it can worsen your heart failure.Also avoid OTC nonsteroidal drugs, such as advil, aleve and motrin.  Try to exercise for at least 30 minutes every day because this will help your heart be more efficient. You may be eligible for supervised cardiac rehab, ask your physician.

## 2018-06-21 LAB — COMPREHENSIVE METABOLIC PANEL
ALK PHOS: 251 IU/L — AB (ref 39–117)
ALT: 29 IU/L (ref 0–44)
AST: 36 IU/L (ref 0–40)
Albumin/Globulin Ratio: 1.3 (ref 1.2–2.2)
Albumin: 4 g/dL (ref 3.5–4.8)
BUN/Creatinine Ratio: 20 (ref 10–24)
BUN: 29 mg/dL — AB (ref 8–27)
Bilirubin Total: 0.7 mg/dL (ref 0.0–1.2)
CHLORIDE: 97 mmol/L (ref 96–106)
CO2: 24 mmol/L (ref 20–29)
Calcium: 9.3 mg/dL (ref 8.6–10.2)
Creatinine, Ser: 1.43 mg/dL — ABNORMAL HIGH (ref 0.76–1.27)
GFR calc Af Amer: 55 mL/min/{1.73_m2} — ABNORMAL LOW (ref >59)
GFR calc non Af Amer: 48 mL/min/{1.73_m2} — ABNORMAL LOW (ref >59)
GLUCOSE: 185 mg/dL — AB (ref 65–99)
Globulin, Total: 3.2 g/dL (ref 1.5–4.5)
Potassium: 4.8 mmol/L (ref 3.5–5.2)
Sodium: 142 mmol/L (ref 134–144)
Total Protein: 7.2 g/dL (ref 6.0–8.5)

## 2018-06-21 LAB — TSH: TSH: 0.743 u[IU]/mL (ref 0.450–4.500)

## 2018-06-21 LAB — PRO B NATRIURETIC PEPTIDE: NT-PRO BNP: 1364 pg/mL — AB (ref 0–376)

## 2018-06-26 ENCOUNTER — Other Ambulatory Visit: Payer: Self-pay | Admitting: *Deleted

## 2018-06-26 MED ORDER — AMIODARONE HCL 200 MG PO TABS: 200.0000 mg | ORAL_TABLET | Freq: Every day | ORAL | 1 refills | Status: DC

## 2018-06-28 ENCOUNTER — Telehealth: Payer: Self-pay

## 2018-06-28 MED ORDER — AMIODARONE HCL 200 MG PO TABS: 200.0000 mg | ORAL_TABLET | Freq: Every day | ORAL | 1 refills | Status: DC

## 2018-06-28 NOTE — Telephone Encounter (Signed)
Rx faxed to pharmacy as requested.

## 2018-07-03 ENCOUNTER — Telehealth: Payer: Self-pay | Admitting: Emergency Medicine

## 2018-07-03 NOTE — Telephone Encounter (Signed)
Patient informed of chest xray results.

## 2018-07-27 DIAGNOSIS — G4700 Insomnia, unspecified: Secondary | ICD-10-CM | POA: Diagnosis not present

## 2018-07-27 DIAGNOSIS — I4892 Unspecified atrial flutter: Secondary | ICD-10-CM | POA: Diagnosis not present

## 2018-07-27 DIAGNOSIS — M159 Polyosteoarthritis, unspecified: Secondary | ICD-10-CM | POA: Diagnosis not present

## 2018-07-27 DIAGNOSIS — R945 Abnormal results of liver function studies: Secondary | ICD-10-CM | POA: Diagnosis not present

## 2018-07-27 DIAGNOSIS — D509 Iron deficiency anemia, unspecified: Secondary | ICD-10-CM | POA: Diagnosis not present

## 2018-07-27 DIAGNOSIS — E7800 Pure hypercholesterolemia, unspecified: Secondary | ICD-10-CM | POA: Diagnosis not present

## 2018-07-27 DIAGNOSIS — I10 Essential (primary) hypertension: Secondary | ICD-10-CM | POA: Diagnosis not present

## 2018-07-27 DIAGNOSIS — Z1339 Encounter for screening examination for other mental health and behavioral disorders: Secondary | ICD-10-CM | POA: Diagnosis not present

## 2018-07-27 DIAGNOSIS — E118 Type 2 diabetes mellitus with unspecified complications: Secondary | ICD-10-CM | POA: Diagnosis not present

## 2018-07-27 DIAGNOSIS — Z23 Encounter for immunization: Secondary | ICD-10-CM | POA: Diagnosis not present

## 2018-07-27 DIAGNOSIS — K219 Gastro-esophageal reflux disease without esophagitis: Secondary | ICD-10-CM | POA: Diagnosis not present

## 2018-08-05 NOTE — Progress Notes (Signed)
Cardiology Office Note:    Date:  08/06/2018   ID:  Antonio Snyder, DOB Jan 12, 1941, MRN [id number]  PCP:  Antonio Nakai, MD  Cardiologist:  Antonio More, MD    Referring MD: Antonio Nakai, MD    ASSESSMENT:    1. Chronic diastolic heart failure ([place])   2. Coronary artery disease involving native coronary artery of native heart with angina pectoris ([place])   3. PAF (paroxysmal atrial fibrillation) ([place])   4. On amiodarone therapy   5. Hyperlipidemia, unspecified hyperlipidemia type    PLAN:    In order of problems listed above:  1. He remains mildly decompensated despite 15 pound weight loss stools 1-2+ edema and will increase the dose of his diuretic.  Labs are checked in his PCP office and he is due for repeat lab work in the next week with mildly abnormal liver function.  I strongly encouraged him to completely sodium restrict and limit meals outside of the home 2. Stable continue medical treatment at this time I would not pursue an ischemia evaluation 3. Stable in sinus rhythm continue amiodarone recheck EKG with junctional rhythm last visit and decrease beta-blocker dose 4. Continue low-dose amiodarone TSH is normal liver functions following his PCP office 5. Continue statin lipids are at target cholesterol 111 HDL 53 LDL 45 performed this month   Next appointment: 3 months   Medication Adjustments/Labs and Tests Ordered: Current medicines are reviewed at length with the patient today.  Concerns regarding medicines are outlined above.  No orders of the defined types were placed in this encounter.  No orders of the defined types were placed in this encounter.   Chief Complaint  Patient presents with  . Atrial Fibrillation  . Congestive Heart Failure  . Coronary Artery Disease    History of Present Illness:    Antonio Snyder is a 77 y.o. male with a hx of CAD CABG EF 45-50% PAF on amiodarone dyslipidemia atrial flutter and heart flutter  last seen 06/20/18. Compliance with  diet, lifestyle and medications: Yes  Weight is down a total of 15 pounds but still is eating outside the home with variable weights he is improved no longer short of breath and still has mild edema no chest pain palpitation or syncope he relates mildly abnormal liver function followed with his PCP due for labs in the next week Past Medical History:  Diagnosis Date  . AKI (acute kidney injury) ([place]) 11/18/2016  . Atrial flutter ([place]) 11/17/2016  . CAD in native artery 04/19/2016   03/2008 CABG LIMA to LAD, SVG DIAG, OM1, OM2  . Chronic diastolic heart failure ([place]) 04/19/2016  . COPD (chronic obstructive pulmonary disease) ([place]) 07/25/2017  . DM (diabetes mellitus) ([place]) 07/25/2017  . Dyslipidemia 01/29/2018  . Essential hypertension 04/19/2016  . 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 Hyperlipidemia 04/19/2016  . Hypertensive heart disease with heart failure ([place]) 04/19/2016  . Hypoxemia 11/21/2016  . Lactic acidosis 08/20/2016  . Left upper lobe pneumonia ([place]) 08/20/2016  . On amiodarone therapy 04/19/2016  . PAF (paroxysmal atrial fibrillation) ([place]) 04/19/2016    Past Surgical History:  Procedure Laterality Date  . BACK SURGERY    . CORONARY ARTERY BYPASS GRAFT    . ELECTROPHYSIOLOGIC STUDY     atrial tach/flutter  . TOTAL HIP REVISION      Current Medications: Current Meds  Medication Sig  . amiodarone (PACERONE) 200 MG tablet Take 1 tablet (200 mg total) by mouth  daily.  . Ascorbic Acid (VITAMIN C) 1000 MG tablet Take 1,000 mg by mouth daily.  Marland Kitchen aspirin EC 81 MG tablet Take 81 mg by mouth daily.  Marland Kitchen atorvastatin (LIPITOR) 40 MG tablet Take 40 mg by mouth daily.  Marland Kitchen glimepiride (AMARYL) 4 MG tablet Take 4 mg by mouth daily.  . metFORMIN (GLUCOPHAGE) 850 MG tablet Take 850 mg by mouth daily.  . metoprolol tartrate (LOPRESSOR) 100 MG tablet Take 0.5 tablets (50 mg total) by mouth daily.  . pantoprazole (PROTONIX) 40 MG tablet Take 40 mg by mouth 2 (two) times daily.   Marland Kitchen torsemide (DEMADEX) 20  MG tablet Take 1 tablet (20 mg total) by mouth 2 (two) times daily.     Allergies:   Patient has no known allergies.   Social History   Socioeconomic History  . Marital status: Married    Spouse name: Not on file  . Number of children: Not on file  . Years of education: Not on file  . Highest education level: Not on file  Occupational History  . Not on file  Social Needs  . Financial resource strain: Not on file  . Food insecurity:    Worry: Not on file    Inability: Not on file  . Transportation needs:    Medical: Not on file    Non-medical: Not on file  Tobacco Use  . Smoking status: Never Smoker  . Smokeless tobacco: Never Used  Substance and Sexual Activity  . Alcohol use: Never    Frequency: Never  . Drug use: Never  . Sexual activity: Not on file  Lifestyle  . Physical activity:    Days per week: Not on file    Minutes per session: Not on file  . Stress: Not on file  Relationships  . Social connections:    Talks on phone: Not on file    Gets together: Not on file    Attends religious service: Not on file    Active member of club or organization: Not on file    Attends meetings of clubs or organizations: Not on file    Relationship status: Not on file  Other Topics Concern  . Not on file  Social History Narrative  . Not on file     Family History: The patient's family history includes Congestive Heart Failure in his mother; Heart attack in his father. ROS:   Please see the history of present illness.    All other systems reviewed and are negative.  EKGs/Labs/Other Studies Reviewed:    The following studies were reviewed today:  EKG:  EKG ordered today.  The ekg ordered today demonstrates sinus rhythm 58 bpm left posterior fascicular block nonspecific ST change  Recent Labs: 07/27/2018 hemoglobin 12.2 creatinine 1.37 TSH normal ALT mildly elevated 43  Physical Exam:    VS:  BP 122/76 (BP Location: Right Arm, Patient Position: Sitting, Cuff Size:  Normal)   Pulse 63   Ht 5\' 4"  (1.626 m)   Wt 191 lb (86.6 kg)   SpO2 92%   BMI 32.79 kg/m     Wt Readings from Last 3 Encounters:  08/06/18 191 lb (86.6 kg)  06/20/18 202 lb (91.6 kg)     GEN: well nourished, well developed in no acute distress HEENT: Normal NECK: No JVD; No carotid bruits LYMPHATICS: No lymphadenopathy CARDIAC: RRR, no murmurs, rubs, gallops RESPIRATORY:  Clear to auscultation without rales, wheezing or rhonchi  ABDOMEN: Soft, non-tender, non-distended MUSCULOSKELETAL: 1-2+ pitting to the  knee bilateral edema; No deformity  SKIN: Warm and dry NEUROLOGIC:  Alert and oriented x 3 PSYCHIATRIC:  Normal affect    Signed, Antonio More, MD  08/06/2018 2:43 PM    Antonio Snyder

## 2018-08-06 ENCOUNTER — Ambulatory Visit: Payer: Medicare HMO | Admitting: Cardiology

## 2018-08-06 ENCOUNTER — Encounter: Payer: Self-pay | Admitting: Cardiology

## 2018-08-06 VITALS — BP 122/76 | HR 63 | Ht 64.0 in | Wt 191.0 lb

## 2018-08-06 DIAGNOSIS — I480 Paroxysmal atrial fibrillation: Secondary | ICD-10-CM | POA: Diagnosis not present

## 2018-08-06 DIAGNOSIS — I25119 Atherosclerotic heart disease of native coronary artery with unspecified angina pectoris: Secondary | ICD-10-CM | POA: Diagnosis not present

## 2018-08-06 DIAGNOSIS — I5032 Chronic diastolic (congestive) heart failure: Secondary | ICD-10-CM

## 2018-08-06 DIAGNOSIS — Z79899 Other long term (current) drug therapy: Secondary | ICD-10-CM

## 2018-08-06 DIAGNOSIS — E785 Hyperlipidemia, unspecified: Secondary | ICD-10-CM

## 2018-08-06 MED ORDER — TORSEMIDE 20 MG PO TABS: ORAL_TABLET | ORAL | 1 refills | Status: DC

## 2018-08-06 NOTE — Patient Instructions (Signed)
Medication Instructions:  Your physician has recommended you make the following change in your medication:   INCREASE torsemide (demadex) 20 mg: Take 1 tablet (20 mg) daily on Tues, Thurs, Sat, Sun. Take 2 tablets (40 mg) in the morning and 1 tablet (20 mg) in the evening on Mon, Wed, Fri.   Labwork: None  Testing/Procedures: You had an EKG today.   Follow-Up: Your physician wants you to follow-up in: 3 months. You will receive a reminder letter in the mail two months in advance. If you don't receive a letter, please call our office to schedule the follow-up appointment.   If you need a refill on your cardiac medications before your next appointment, please call your pharmacy.   Thank you for choosing [facility]! Robyne Peers, RN [phone]

## 2018-08-24 DIAGNOSIS — Z23 Encounter for immunization: Secondary | ICD-10-CM | POA: Diagnosis not present

## 2018-08-24 DIAGNOSIS — M159 Polyosteoarthritis, unspecified: Secondary | ICD-10-CM | POA: Diagnosis not present

## 2018-08-24 DIAGNOSIS — G4700 Insomnia, unspecified: Secondary | ICD-10-CM | POA: Diagnosis not present

## 2018-08-24 DIAGNOSIS — D509 Iron deficiency anemia, unspecified: Secondary | ICD-10-CM | POA: Diagnosis not present

## 2018-08-24 DIAGNOSIS — Z1339 Encounter for screening examination for other mental health and behavioral disorders: Secondary | ICD-10-CM | POA: Diagnosis not present

## 2018-08-24 DIAGNOSIS — K219 Gastro-esophageal reflux disease without esophagitis: Secondary | ICD-10-CM | POA: Diagnosis not present

## 2018-08-24 DIAGNOSIS — R945 Abnormal results of liver function studies: Secondary | ICD-10-CM | POA: Diagnosis not present

## 2018-08-24 DIAGNOSIS — I4892 Unspecified atrial flutter: Secondary | ICD-10-CM | POA: Diagnosis not present

## 2018-08-24 DIAGNOSIS — M549 Dorsalgia, unspecified: Secondary | ICD-10-CM | POA: Diagnosis not present

## 2018-10-31 NOTE — Progress Notes (Signed)
Cardiology Office Note:    Date:  11/01/2018   ID:  Antonio Snyder, DOB 1940/11/16, MRN [id number]  PCP:  Cher Nakai, MD  Cardiologist:  Shirlee More, MD    Referring MD: Cher Nakai, MD    ASSESSMENT:    1. Chronic diastolic heart failure ([place])   2. Hypertensive heart disease with heart failure ([place])   3. PAF (paroxysmal atrial fibrillation) ([place])   4. On amiodarone therapy   5. Dyslipidemia    PLAN:    In order of problems listed above:  1. Stable he relates taking good care of himself he has no edema he is compensated New York Heart Association class I I asked him to continue his current diuretic sodium restriction and self-management.  He is due to be seen with Dr. Truman Hayward beginning January and labs were checked in his office and they will include liver function thyroid with amiodarone creatinine potassium and I will asked him to draw a proBNP level. 2. Stable continue treatment including beta-blocker 3. Stable obviously in sinus rhythm EKG last visit in September was normal he will continue his current low-dose amiodarone no longer anticoagulated 4. Continue low-dose amiodarone no evidence of toxicity 5. Stable continue statin his last lipid profile performed with Dr. Truman Hayward was ideal 07/27/2018 cholesterol 111 LDL 45   Next appointment: 4 months   Medication Adjustments/Labs and Tests Ordered: Current medicines are reviewed at length with the patient today.  Concerns regarding medicines are outlined above.  No orders of the defined types were placed in this encounter.  No orders of the defined types were placed in this encounter.   Chief Complaint  Patient presents with  . Follow-up  . Congestive Heart Failure    History of Present Illness:     Antonio Snyder is a 77 y.o. male with a hx of CAD CABG EF 45-50% PAF on amiodarone dyslipidemia atrial flutter and heart flutter  last seen 08/06/2018.  His heart failure was improved but he was still fluid overloaded and his  diuretic dose was increased.  He remained in sinus rhythm on amiodarone low dosage. Compliance with diet, lifestyle and medications: Yes  He is focused on caring for himself he sodium restricts his weights are stable he has had no edema or shortness of breath and even traveling to [place] for funeral his heart failure did not get out of control.  He is pleased with the quality of his life he has 2 complaints of pain in the left shoulder left knee it is related to his arthritis and joint pain that limits him but no shortness of breath chest pain palpitations syncope TIA or side effects related to his medications.  He is taken low-dose aspirin and is not anticoagulated as he is maintaining sinus rhythm with amiodarone Past Medical History:  Diagnosis Date  . AKI (acute kidney injury) ([place]) 11/18/2016  . Atrial flutter ([place]) 11/17/2016  . CAD in native artery 04/19/2016   03/2008 CABG LIMA to LAD, SVG DIAG, OM1, OM2  . Chronic diastolic heart failure ([place]) 04/19/2016  . COPD (chronic obstructive pulmonary disease) ([place]) 07/25/2017  . DM (diabetes mellitus) ([place]) 07/25/2017  . Dyslipidemia 01/29/2018  . Essential hypertension 04/19/2016  . 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 Hyperlipidemia 04/19/2016  . Hypertensive heart disease with heart failure ([place]) 04/19/2016  . Hypoxemia 11/21/2016  . Lactic acidosis 08/20/2016  . Left upper lobe pneumonia ([place]) 08/20/2016  . On amiodarone therapy 04/19/2016  .  PAF (paroxysmal atrial fibrillation) ([place]) 04/19/2016    Past Surgical History:  Procedure Laterality Date  . BACK SURGERY    . CORONARY ARTERY BYPASS GRAFT    . ELECTROPHYSIOLOGIC STUDY     atrial tach/flutter  . TOTAL HIP REVISION      Current Medications: Current Meds  Medication Sig  . amiodarone (PACERONE) 200 MG tablet Take 1 tablet (200 mg total) by mouth daily.  . Ascorbic Acid (VITAMIN C) 1000 MG tablet Take 1,000 mg by mouth daily.  Marland Kitchen aspirin EC 81 MG tablet Take 81 mg by mouth daily.  Marland Kitchen  atorvastatin (LIPITOR) 40 MG tablet Take 40 mg by mouth daily.  Marland Kitchen glimepiride (AMARYL) 4 MG tablet Take 4 mg by mouth daily.  . metFORMIN (GLUCOPHAGE) 850 MG tablet Take 850 mg by mouth daily.  . metoprolol tartrate (LOPRESSOR) 100 MG tablet Take 0.5 tablets (50 mg total) by mouth daily.  . pantoprazole (PROTONIX) 40 MG tablet Take 40 mg by mouth 2 (two) times daily.   Marland Kitchen torsemide (DEMADEX) 20 MG tablet Take 1 tablet daily on Tues, Thurs, Sat, Sun. Take 2 tablets (40 mg) by mouth in the morning and 1 tablet  in the afternoon on M,W,F.  . VITAMIN K PO Take 5,000 Units by mouth daily at 2 PM.     Allergies:   Patient has no known allergies.   Social History   Socioeconomic History  . Marital status: Married    Spouse name: Not on file  . Number of children: Not on file  . Years of education: Not on file  . Highest education level: Not on file  Occupational History  . Not on file  Social Needs  . Financial resource strain: Not on file  . Food insecurity:    Worry: Not on file    Inability: Not on file  . Transportation needs:    Medical: Not on file    Non-medical: Not on file  Tobacco Use  . Smoking status: Never Smoker  . Smokeless tobacco: Never Used  Substance and Sexual Activity  . Alcohol use: Never    Frequency: Never  . Drug use: Never  . Sexual activity: Not on file  Lifestyle  . Physical activity:    Days per week: Not on file    Minutes per session: Not on file  . Stress: Not on file  Relationships  . Social connections:    Talks on phone: Not on file    Gets together: Not on file    Attends religious service: Not on file    Active member of club or organization: Not on file    Attends meetings of clubs or organizations: Not on file    Relationship status: Not on file  Other Topics Concern  . Not on file  Social History Narrative  . Not on file     Family History: The patient's family history includes Congestive Heart Failure in his mother; Heart  attack in his father. ROS:   Please see the history of present illness.    All other systems reviewed and are negative.  EKGs/Labs/Other Studies Reviewed:    The following studies were reviewed today:  Recent Labs: 06/20/2018: ALT 29; BUN 29; Creatinine, Ser 1.43; NT-Pro BNP 1,364; Potassium 4.8; Sodium 142; TSH 0.743  Recent Lipid Panel No results found for: CHOL, TRIG, HDL, CHOLHDL, VLDL, LDLCALC, LDLDIRECT  Physical Exam:    VS:  BP 122/72 (BP Location: Right Arm, Patient Position: Sitting, Cuff Size:  Normal)   Pulse 67   Ht 5\' 4"  (1.626 m)   Wt 193 lb 9.6 oz (87.8 kg)   SpO2 91%   BMI 33.23 kg/m     Wt Readings from Last 3 Encounters:  11/01/18 193 lb 9.6 oz (87.8 kg)  08/06/18 191 lb (86.6 kg)  06/20/18 202 lb (91.6 kg)     GEN:  Well nourished, well developed in no acute distress HEENT: Normal NECK: No JVD; No carotid bruits LYMPHATICS: No lymphadenopathy CARDIAC: RRR, no murmurs, rubs, gallops RESPIRATORY:  Clear to auscultation without rales, wheezing or rhonchi  ABDOMEN: Soft, non-tender, non-distended MUSCULOSKELETAL:  No edema; No deformity  SKIN: Warm and dry NEUROLOGIC:  Alert and oriented x 3 PSYCHIATRIC:  Normal affect    Signed, Shirlee More, MD  11/01/2018 8:24 AM    [place]

## 2018-11-01 ENCOUNTER — Ambulatory Visit: Payer: Medicare HMO | Admitting: Cardiology

## 2018-11-01 ENCOUNTER — Encounter: Payer: Self-pay | Admitting: Cardiology

## 2018-11-01 VITALS — BP 122/72 | HR 67 | Ht 64.0 in | Wt 193.6 lb

## 2018-11-01 DIAGNOSIS — I110 Hypertensive heart disease with heart failure: Secondary | ICD-10-CM

## 2018-11-01 DIAGNOSIS — Z79899 Other long term (current) drug therapy: Secondary | ICD-10-CM | POA: Diagnosis not present

## 2018-11-01 DIAGNOSIS — I480 Paroxysmal atrial fibrillation: Secondary | ICD-10-CM

## 2018-11-01 DIAGNOSIS — E785 Hyperlipidemia, unspecified: Secondary | ICD-10-CM

## 2018-11-01 DIAGNOSIS — I5032 Chronic diastolic (congestive) heart failure: Secondary | ICD-10-CM

## 2018-11-01 NOTE — Patient Instructions (Signed)
Medication Instructions:  Your physician recommends that you continue on your current medications as directed. Please refer to the Current Medication list given to you today.  If you need a refill on your cardiac medications before your next appointment, please call your pharmacy.   Lab work: None  If you have labs (blood work) drawn today and your tests are completely normal, you will receive your results only by: Marland Kitchen MyChart Message (if you have MyChart) OR . A paper copy in the mail If you have any lab test that is abnormal or we need to change your treatment, we will call you to review the results.  Testing/Procedures: None  Follow-Up: At [facility], you and your health needs are our priority.  As part of our continuing mission to provide you with exceptional heart care, we have created designated Provider Care Teams.  These Care Teams include your primary Cardiologist (physician) and Advanced Practice Providers (APPs -  Physician Assistants and Nurse Practitioners) who all work together to provide you with the care you need, when you need it.  You will need a follow up appointment in 4 months.  Please call our office 2 months in advance to schedule this appointment.  You may see another member of our [employer] Provider Team in [place]: Jenne Campus, MD . Jyl Heinz, MD  Any Other Special Instructions Will Be Listed Below (If Applicable).

## 2018-11-08 ENCOUNTER — Other Ambulatory Visit: Payer: Self-pay | Admitting: Cardiology

## 2018-11-08 DIAGNOSIS — I480 Paroxysmal atrial fibrillation: Secondary | ICD-10-CM

## 2018-11-20 ENCOUNTER — Other Ambulatory Visit: Payer: Self-pay | Admitting: Cardiology

## 2018-11-26 DIAGNOSIS — K219 Gastro-esophageal reflux disease without esophagitis: Secondary | ICD-10-CM | POA: Diagnosis not present

## 2018-11-26 DIAGNOSIS — I4892 Unspecified atrial flutter: Secondary | ICD-10-CM | POA: Diagnosis not present

## 2018-11-26 DIAGNOSIS — Z1331 Encounter for screening for depression: Secondary | ICD-10-CM | POA: Diagnosis not present

## 2018-11-26 DIAGNOSIS — E118 Type 2 diabetes mellitus with unspecified complications: Secondary | ICD-10-CM | POA: Diagnosis not present

## 2018-11-26 DIAGNOSIS — I10 Essential (primary) hypertension: Secondary | ICD-10-CM | POA: Diagnosis not present

## 2018-11-26 DIAGNOSIS — M159 Polyosteoarthritis, unspecified: Secondary | ICD-10-CM | POA: Diagnosis not present

## 2018-11-26 DIAGNOSIS — G4700 Insomnia, unspecified: Secondary | ICD-10-CM | POA: Diagnosis not present

## 2018-11-26 DIAGNOSIS — E7800 Pure hypercholesterolemia, unspecified: Secondary | ICD-10-CM | POA: Diagnosis not present

## 2018-11-26 DIAGNOSIS — D509 Iron deficiency anemia, unspecified: Secondary | ICD-10-CM | POA: Diagnosis not present

## 2018-11-26 DIAGNOSIS — R600 Localized edema: Secondary | ICD-10-CM | POA: Diagnosis not present

## 2018-11-26 DIAGNOSIS — M549 Dorsalgia, unspecified: Secondary | ICD-10-CM | POA: Diagnosis not present

## 2018-12-05 DIAGNOSIS — K219 Gastro-esophageal reflux disease without esophagitis: Secondary | ICD-10-CM | POA: Diagnosis not present

## 2018-12-05 DIAGNOSIS — M159 Polyosteoarthritis, unspecified: Secondary | ICD-10-CM | POA: Diagnosis not present

## 2018-12-05 DIAGNOSIS — E785 Hyperlipidemia, unspecified: Secondary | ICD-10-CM | POA: Diagnosis not present

## 2018-12-05 DIAGNOSIS — Z9181 History of falling: Secondary | ICD-10-CM | POA: Diagnosis not present

## 2018-12-05 DIAGNOSIS — E118 Type 2 diabetes mellitus with unspecified complications: Secondary | ICD-10-CM | POA: Diagnosis not present

## 2018-12-05 DIAGNOSIS — I4892 Unspecified atrial flutter: Secondary | ICD-10-CM | POA: Diagnosis not present

## 2018-12-05 DIAGNOSIS — Z1331 Encounter for screening for depression: Secondary | ICD-10-CM | POA: Diagnosis not present

## 2018-12-05 DIAGNOSIS — Z0000 Encounter for general adult medical examination without abnormal findings: Secondary | ICD-10-CM | POA: Diagnosis not present

## 2018-12-05 DIAGNOSIS — Z139 Encounter for screening, unspecified: Secondary | ICD-10-CM | POA: Diagnosis not present

## 2018-12-11 DIAGNOSIS — M159 Polyosteoarthritis, unspecified: Secondary | ICD-10-CM | POA: Diagnosis not present

## 2018-12-11 DIAGNOSIS — K219 Gastro-esophageal reflux disease without esophagitis: Secondary | ICD-10-CM | POA: Diagnosis not present

## 2018-12-11 DIAGNOSIS — E118 Type 2 diabetes mellitus with unspecified complications: Secondary | ICD-10-CM | POA: Diagnosis not present

## 2018-12-11 DIAGNOSIS — D509 Iron deficiency anemia, unspecified: Secondary | ICD-10-CM | POA: Diagnosis not present

## 2018-12-11 DIAGNOSIS — M549 Dorsalgia, unspecified: Secondary | ICD-10-CM | POA: Diagnosis not present

## 2018-12-11 DIAGNOSIS — R600 Localized edema: Secondary | ICD-10-CM | POA: Diagnosis not present

## 2018-12-11 DIAGNOSIS — G4700 Insomnia, unspecified: Secondary | ICD-10-CM | POA: Diagnosis not present

## 2018-12-11 DIAGNOSIS — E669 Obesity, unspecified: Secondary | ICD-10-CM | POA: Diagnosis not present

## 2018-12-11 DIAGNOSIS — I4892 Unspecified atrial flutter: Secondary | ICD-10-CM | POA: Diagnosis not present

## 2019-01-11 DIAGNOSIS — D509 Iron deficiency anemia, unspecified: Secondary | ICD-10-CM | POA: Diagnosis not present

## 2019-01-11 DIAGNOSIS — I4892 Unspecified atrial flutter: Secondary | ICD-10-CM | POA: Diagnosis not present

## 2019-01-11 DIAGNOSIS — K219 Gastro-esophageal reflux disease without esophagitis: Secondary | ICD-10-CM | POA: Diagnosis not present

## 2019-01-11 DIAGNOSIS — M159 Polyosteoarthritis, unspecified: Secondary | ICD-10-CM | POA: Diagnosis not present

## 2019-01-11 DIAGNOSIS — E669 Obesity, unspecified: Secondary | ICD-10-CM | POA: Diagnosis not present

## 2019-01-11 DIAGNOSIS — E118 Type 2 diabetes mellitus with unspecified complications: Secondary | ICD-10-CM | POA: Diagnosis not present

## 2019-01-11 DIAGNOSIS — R600 Localized edema: Secondary | ICD-10-CM | POA: Diagnosis not present

## 2019-01-11 DIAGNOSIS — G4700 Insomnia, unspecified: Secondary | ICD-10-CM | POA: Diagnosis not present

## 2019-01-11 DIAGNOSIS — M549 Dorsalgia, unspecified: Secondary | ICD-10-CM | POA: Diagnosis not present

## 2019-01-23 ENCOUNTER — Other Ambulatory Visit: Payer: Self-pay | Admitting: Cardiology

## 2019-02-13 DIAGNOSIS — K219 Gastro-esophageal reflux disease without esophagitis: Secondary | ICD-10-CM | POA: Diagnosis not present

## 2019-02-13 DIAGNOSIS — M549 Dorsalgia, unspecified: Secondary | ICD-10-CM | POA: Diagnosis not present

## 2019-02-13 DIAGNOSIS — D509 Iron deficiency anemia, unspecified: Secondary | ICD-10-CM | POA: Diagnosis not present

## 2019-02-13 DIAGNOSIS — I4892 Unspecified atrial flutter: Secondary | ICD-10-CM | POA: Diagnosis not present

## 2019-02-13 DIAGNOSIS — R600 Localized edema: Secondary | ICD-10-CM | POA: Diagnosis not present

## 2019-02-13 DIAGNOSIS — E669 Obesity, unspecified: Secondary | ICD-10-CM | POA: Diagnosis not present

## 2019-02-13 DIAGNOSIS — M159 Polyosteoarthritis, unspecified: Secondary | ICD-10-CM | POA: Diagnosis not present

## 2019-02-13 DIAGNOSIS — E118 Type 2 diabetes mellitus with unspecified complications: Secondary | ICD-10-CM | POA: Diagnosis not present

## 2019-02-13 DIAGNOSIS — G4700 Insomnia, unspecified: Secondary | ICD-10-CM | POA: Diagnosis not present

## 2019-02-22 ENCOUNTER — Telehealth: Payer: Self-pay | Admitting: *Deleted

## 2019-02-22 NOTE — Telephone Encounter (Signed)
Left message on mobile to get consent for Virtual Visit on Friday, 4/17

## 2019-02-26 NOTE — Telephone Encounter (Signed)
YOUR CARDIOLOGY TEAM HAS ARRANGED FOR AN E-VISIT FOR YOUR APPOINTMENT - PLEASE REVIEW IMPORTANT INFORMATION BELOW SEVERAL DAYS PRIOR TO YOUR APPOINTMENT  Due to the recent COVID-19 pandemic, we are transitioning in-person office visits to tele-medicine visits in an effort to decrease unnecessary exposure to our patients, their families, and staff. Medicare and most insurances are covering these visits without a copay needed. We also encourage you to sign up for MyChart if you have not already done so. You will need a smartphone if possible. For patients that do not have this, we can still complete the visit using a regular telephone but do prefer a smartphone to enable video when possible. You may have a family member that lives with you that can help. If possible, we also ask that you have a blood pressure cuff and scale at home to measure your blood pressure, heart rate and weight prior to your scheduled appointment. Patients with clinical needs that need an in-person evaluation and testing will still be able to come to the office if absolutely necessary. If you have any questions, feel free to call our office.     YOUR PROVIDER WILL BE USING THE FOLLOWING PLATFORM TO COMPLETE YOUR VISIT: Staff: Please delete this text and fill in WebEx/MyChart/Doximity/Doxy.Me  . IF USING WEBEX - How to Download the WebEx App to Your SmartPhone  - If Apple device, go to [employer] and type in WebEx in the search bar. [employer], the blue/green circle. If Android, go to [employer] and type in [employer] in the search bar. The app is free but as with any other app download, your phone may require you to verify saved payment information or Apple/Android password.  - You do NOT have to create an account. - On the day of the visit, our staff will walk you through joining the meeting with the meeting number/password.  Antonio Snyder USING MYCHART - How to Download the MyChart App to Your SmartPhone   - If  Apple, go to [employer] and type in MyChart in the search bar and download the app. If Android, ask patient to go to [employer] and type in [place] in the search bar and download the app. The app is free but as with any other app downloads, your phone may require you to verify saved payment information or Apple/Android password.  - You will need to then log into the app with your MyChart username and password, and select [facility] as your healthcare provider to link the account. When it is time for your visit, go to the MyChart app, find appointments, and click Begin Video Visit. Be sure to Select Allow for your device to access the Microphone and Camera for your visit. You will then be connected, and your provider will be with you shortly.  **If you have any issues connecting or need assistance, please contact MyChart service desk [phone])**  **If using a computer, in order to ensure the best quality for your visit, you will need to use either of the following Internet Browsers: [employer], or [employer]**  . IF USING DOXIMITY or DOXY.ME - The staff will give you instructions on receiving your link to join the meeting the day of your visit.      2-3 DAYS BEFORE YOUR APPOINTMENT  You will receive a telephone call from one of our [place] team members - your caller ID may say "Unknown caller." If this is a video visit, we  will walk you through how to set up your device to be able to complete the visit. We will remind you check your blood pressure, heart rate and weight prior to your scheduled appointment. If you have an Apple Watch or Kardia, please upload any pertinent ECG strips the day before or morning of your appointment to [place]. Our staff will also make sure you have reviewed the consent and agree to move forward with your scheduled tele-health visit.     THE DAY OF YOUR APPOINTMENT  Approximately 15 minutes prior to your scheduled appointment, you will  receive a telephone call from one of [place] team - your caller ID may say "Unknown caller."  Our staff will confirm medications, vital signs for the day and any symptoms you may be experiencing. Please have this information available prior to the time of visit start. It may also be helpful for you to have a pad of paper and pen handy for any instructions given during your visit. They will also walk you through joining the smartphone meeting if this is a video visit.    CONSENT FOR TELE-HEALTH VISIT - PLEASE REVIEW  I hereby voluntarily request, consent and authorize [facility] and its employed or contracted physicians, physician assistants, nurse practitioners or other licensed health care professionals (the Practitioner), to provide me with telemedicine health care services (the "Services") as deemed necessary by the treating Practitioner. I acknowledge and consent to receive the Services by the Practitioner via telemedicine. I understand that the telemedicine visit will involve communicating with the Practitioner through live audiovisual communication technology and the disclosure of certain medical information by electronic transmission. I acknowledge that I have been given the opportunity to request an in-person assessment or other available alternative prior to the telemedicine visit and am voluntarily participating in the telemedicine visit.  I understand that I have the right to withhold or withdraw my consent to the use of telemedicine in the course of my care at any time, without affecting my right to future care or treatment, and that the Practitioner or I may terminate the telemedicine visit at any time. I understand that I have the right to inspect all information obtained and/or recorded in the course of the telemedicine visit and may receive copies of available information for a reasonable fee.  I understand that some of the potential risks of receiving the Services via telemedicine  include:  Marland Kitchen Delay or interruption in medical evaluation due to technological equipment failure or disruption; . Information transmitted may not be sufficient (e.g. poor resolution of images) to allow for appropriate medical decision making by the Practitioner; and/or  . In rare instances, security protocols could fail, causing a breach of personal health information.  Furthermore, I acknowledge that it is my responsibility to provide information about my medical history, conditions and care that is complete and accurate to the best of my ability. I acknowledge that Practitioner's advice, recommendations, and/or decision may be based on factors not within their control, such as incomplete or inaccurate data provided by me or distortions of diagnostic images or specimens that may result from electronic transmissions. I understand that the practice of medicine is not an exact science and that Practitioner makes no warranties or guarantees regarding treatment outcomes. I acknowledge that I will receive a copy of this consent concurrently upon execution via email to the email address I last provided but may also request a printed copy by calling the office of [place].    I understand  that my insurance will be billed for this visit.   I have read or had this consent read to me. . I understand the contents of this consent, which adequately explains the benefits and risks of the Services being provided via telemedicine.  . I have been provided ample opportunity to ask questions regarding this consent and the Services and have had my questions answered to my satisfaction. . I give my informed consent for the services to be provided through the use of telemedicine in my medical care  By participating in this telemedicine visit I agree to the above. Yes

## 2019-02-26 NOTE — Telephone Encounter (Signed)

## 2019-03-01 ENCOUNTER — Encounter: Payer: Self-pay | Admitting: Cardiology

## 2019-03-01 ENCOUNTER — Telehealth (INDEPENDENT_AMBULATORY_CARE_PROVIDER_SITE_OTHER): Payer: Medicare HMO | Admitting: Cardiology

## 2019-03-01 VITALS — BP 112/59 | HR 53 | Ht 64.0 in | Wt 188.2 lb

## 2019-03-01 DIAGNOSIS — I25119 Atherosclerotic heart disease of native coronary artery with unspecified angina pectoris: Secondary | ICD-10-CM | POA: Diagnosis not present

## 2019-03-01 DIAGNOSIS — Z79899 Other long term (current) drug therapy: Secondary | ICD-10-CM

## 2019-03-01 DIAGNOSIS — E785 Hyperlipidemia, unspecified: Secondary | ICD-10-CM

## 2019-03-01 DIAGNOSIS — I5032 Chronic diastolic (congestive) heart failure: Secondary | ICD-10-CM

## 2019-03-01 DIAGNOSIS — Z7189 Other specified counseling: Secondary | ICD-10-CM

## 2019-03-01 DIAGNOSIS — I4892 Unspecified atrial flutter: Secondary | ICD-10-CM

## 2019-03-01 DIAGNOSIS — I110 Hypertensive heart disease with heart failure: Secondary | ICD-10-CM

## 2019-03-01 DIAGNOSIS — I480 Paroxysmal atrial fibrillation: Secondary | ICD-10-CM

## 2019-03-01 MED ORDER — ATORVASTATIN CALCIUM 40 MG PO TABS: 40.0000 mg | ORAL_TABLET | Freq: Every day | ORAL | 1 refills | Status: DC

## 2019-03-01 MED ORDER — AMIODARONE HCL 200 MG PO TABS: 200.0000 mg | ORAL_TABLET | Freq: Every day | ORAL | 1 refills | Status: DC

## 2019-03-01 MED ORDER — METOPROLOL TARTRATE 100 MG PO TABS: 50.0000 mg | ORAL_TABLET | Freq: Every day | ORAL | 3 refills | Status: DC

## 2019-03-01 MED ORDER — TORSEMIDE 20 MG PO TABS: ORAL_TABLET | ORAL | 0 refills | Status: DC

## 2019-03-01 NOTE — Progress Notes (Signed)
Virtual Visit via Video Note   This visit type was conducted due to national recommendations for restrictions regarding the COVID-19 Pandemic (e.g. social distancing) in an effort to limit this patient's exposure and mitigate transmission in our community.  Due to his co-morbid illnesses, this patient is at least at moderate risk for complications without adequate follow up.  This format is felt to be most appropriate for this patient at this time.  All issues noted in this document were discussed and addressed.  A limited physical exam was performed with this format.  Please refer to the patient's chart for his consent to telehealth for [facility].   Evaluation Performed:  Follow-up visit  Date:  03/01/2019   ID:  Rosa, [place] 09/28/1941, MRN [id number]  Patient Location: Home Provider Location: Office  PCP:  Cher Nakai, MD Dr. Truman Hayward please do a CMP TSH on amiodarone along with lipids every 6 months Cardiologist:  No primary care provider on file. [facility] Electrophysiologist:  None   Chief Complaint: Follow-up for coronary artery disease heart failure atrial flutter and amiodarone therapy  History of Present Illness:    Antonio Snyder is a 78 y.o. 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  At his last visit his heart failure was compensated New York Heart Association class II hypertension was controlled he was maintaining sinus rhythm on amiodarone and his lipids are ideal with an LDL of 45 on a statin  The patient does not have symptoms concerning for COVID-19 infection (fever, chills, cough, or new shortness of breath).   Several family members developed COVID-19 he did not have direct contact with him brother-in-law died and a nephew is still in the hospital in [place] on a ventilator.  He practices isolation social distance good handwashing technique and wears a mask when he is  outdoors. \ Baron takes very good care of himself weighs daily restrict sodium uses a pillbox and compliant with his medications.  His weights are stable he has no edema orthopnea shortness of breath chest pain palpitation syncope or TIA.  He continues on low-dose amiodarone labs were performed in his PCP office 11/26/2018 cholesterol 145 HDL 57 LDL 66 creatinine 1.43 TSH 0.7 potassium 4.7. Past Medical History:  Diagnosis Date   AKI (acute kidney injury) ([place]) 11/18/2016   Atrial flutter ([place]) 11/17/2016   CAD in native artery 04/19/2016   03/2008 CABG LIMA to LAD, SVG DIAG, OM1, OM2   Chronic diastolic heart failure ([place]) 04/19/2016   COPD (chronic obstructive pulmonary disease) ([place]) 07/25/2017   DM (diabetes mellitus) ([place]) 07/25/2017   Dyslipidemia 01/29/2018   Essential hypertension 04/19/2016   Hx of CABG 04/19/2016   Overview:  2009   Hyperlipidemia 04/19/2016   Hypertensive heart disease with heart failure ([place]) 04/19/2016   Hypoxemia 11/21/2016   Lactic acidosis 08/20/2016   Left upper lobe pneumonia ([place]) 08/20/2016   On amiodarone therapy 04/19/2016   PAF (paroxysmal atrial fibrillation) ([place]) 04/19/2016   Past Surgical History:  Procedure Laterality Date   BACK SURGERY     CORONARY ARTERY BYPASS GRAFT     ELECTROPHYSIOLOGIC STUDY     atrial tach/flutter   TOTAL HIP REVISION       Current Meds  Medication Sig   amiodarone (PACERONE) 200 MG tablet TAKE 1 TABLET EVERY DAY   Ascorbic Acid (VITAMIN C) 1000 MG tablet Take 1,000 mg by mouth daily.  aspirin EC 81 MG tablet Take 81 mg by mouth daily.   atorvastatin (LIPITOR) 40 MG tablet Take 40 mg by mouth daily.   glimepiride (AMARYL) 4 MG tablet Take 4 mg by mouth as directed. Take 1 tablet (4mg ) in the morning and 0.5 tablet (2 mg) in the evening   metFORMIN (GLUCOPHAGE) 850 MG tablet Take 850 mg by mouth 2 (two) times daily with a meal.    metoprolol tartrate (LOPRESSOR) 100 MG tablet TAKE 1/2 TABLET EVERY DAY    pantoprazole (PROTONIX) 40 MG tablet Take 40 mg by mouth 2 (two) times daily.    torsemide (DEMADEX) 20 MG tablet TAKE 1 TAB ON TUESDAY, THURS, SAT, SUN. TAKE 2 TABS (40 MG) IN THE MORNING AND 1 TAB IN THE AFTERNOON ON MON, WED AND FRIDAY   VITAMIN K PO Take 5,000 Units by mouth daily at 2 PM.     Allergies:   Patient has no known allergies.   Social History   Tobacco Use   Smoking status: Never Smoker   Smokeless tobacco: Never Used  Substance Use Topics   Alcohol use: Never    Frequency: Never   Drug use: Never     Family Hx: The patient's family history includes Congestive Heart Failure in his mother; Heart attack in his father.  ROS:   Please see the history of present illness.     All other systems reviewed and are negative.   Prior CV studies:   The following studies were reviewed today:    Labs/Other Tests and Data Reviewed:    EKG:  No ECG reviewed.  Recent Labs: 06/20/2018: ALT 29; BUN 29; Creatinine, Ser 1.43; NT-Pro BNP 1,364; Potassium 4.8; Sodium 142; TSH 0.743   Recent Lipid Panel No results found for: CHOL, TRIG, HDL, CHOLHDL, LDLCALC, LDLDIRECT  Wt Readings from Last 3 Encounters:  11/01/18 193 lb 9.6 oz (87.8 kg)  08/06/18 191 lb (86.6 kg)  06/20/18 202 lb (91.6 kg)     Objective:    Vital Signs:  There were no vitals taken for this visit.   VITAL SIGNS:  reviewed GEN:  no acute distress EYES:  sclerae anicteric, EOMI - Extraocular Movements Intact RESPIRATORY:  normal respiratory effort, symmetric expansion CARDIOVASCULAR:  no peripheral edema SKIN:  no rash, lesions or ulcers. MUSCULOSKELETAL:  no obvious deformities. NEURO:  alert and oriented x 3, no obvious focal deficit PSYCH:  normal affect  ASSESSMENT & PLAN:    1. Stable CAD after revascularization having no angina on current medications New York Heart Association class I I will ask him to continue medical treatment including aspirin beta-blocker and high intensity statin  and at this time I do not think he requires an ischemia evaluation 2. Heart failure chronic diastolic New York Heart Association class I he is meticulous in self-care compliant with medications compensators and will continue his current dose of loop diuretic.  If additional therapy is needed for diabetes SGLT2 would be ideal with his heart failure and CAD 3. Hypertensive heart disease with heart failure stable continue current medical therapy for blood pressure diuretic beta-blocker 4. Dyslipidemia stable lipids are at target normal liver function continue his high intensity statin with CAD 5. Atrial flutter stable asymptomatic continue amiodarone therapy next visit he will need an EKG.  I will talk with the patient about purchasing the iPhone adapter so he can screen his heart rhythm at home. 6. On amiodarone therapy stable labs are checked by his PCP I will plan  to do chest x-ray prior to his visit I remind Dr. Truman Hayward to check a TSH with the each laboratory session  COVID-19 Education: The signs and symptoms of COVID-19 were discussed with the patient and how to seek care for testing (follow up with PCP or arrange E-visit).  The importance of social distancing was discussed today.  Time:   Today, I have spent 30 minutes with the patient with telehealth technology discussing the above problems.     Medication Adjustments/Labs and Tests Ordered: Current medicines are reviewed at length with the patient today.  Concerns regarding medicines are outlined above.   Tests Ordered: No orders of the defined types were placed in this encounter.   Medication Changes: No orders of the defined types were placed in this encounter.   Disposition:  Follow up September with a chest x-ray prior to the visit on amiodarone  Signed, Shirlee More, MD  03/01/2019 8:55 AM    [place]

## 2019-03-01 NOTE — Patient Instructions (Addendum)
Medication Instructions:  Your physician recommends that you continue on your current medications as directed. Please refer to the Current Medication list given to you today.  If you need a refill on your cardiac medications before your next appointment, please call your pharmacy.   Lab work: [place] If you have labs (blood work) drawn today and your tests are completely normal, you will receive your results only by: Marland Kitchen MyChart Message (if you have MyChart) OR . A paper copy in the mail If you have any lab test that is abnormal or we need to change your treatment, we will call you to review the results.  Testing/Procedures: CHEST XRAY IN AUGUST AT [facility] OUTPATIENT PRIOR TO APPOINTMENT  Follow-Up: At [facility], you and your health needs are our priority.  As part of our continuing mission to provide you with exceptional heart care, we have created designated Provider Care Teams.  These Care Teams include your primary Cardiologist (physician) and Advanced Practice Providers (APPs -  Physician Assistants and Nurse Practitioners) who all work together to provide you with the care you need, when you need.         Heart Failure  Weigh yourself every morning when you first wake up and record on a calender or note pad, bring this to your office visits. Using a pill tender can help with taking your medications consistently.  Limit your fluid intake to 2 liters daily  Limit your sodium intake to less than 2-3 grams daily. Ask if you need dietary teaching.  If you gain more than 3 pounds (from your dry weight ), double your dose of diuretic for the day.  If you gain more than 5 pounds (from your dry weight), double your dose of lasix and call your heart failure doctor.  Please do not smoke tobacco since it is very bad for your heart.  Please do not drink alcohol since it can worsen your heart failure.Also avoid OTC nonsteroidal drugs, such as advil, aleve and motrin.  Try to  exercise for at least 30 minutes every day because this will help your heart be more efficient. You may be eligible for supervised cardiac rehab, ask your physician.    Heart Failure  Weigh yourself every morning when you first wake up and record on a calender or note pad, bring this to your office visits. Using a pill tender can help with taking your medications consistently.  Limit your fluid intake to 2 liters daily  Limit your sodium intake to less than 2-3 grams daily. Ask if you need dietary teaching.  If you gain more than 3 pounds (from your dry weight ), double your dose of diuretic for the day.  If you gain more than 5 pounds (from your dry weight), double your dose of lasix and call your heart failure doctor.  Please do not smoke tobacco since it is very bad for your heart.  Please do not drink alcohol since it can worsen your heart failure.Also avoid OTC nonsteroidal drugs, such as advil, aleve and motrin.  Try to exercise for at least 30 minutes every day because this will help your heart be more efficient. You may be eligible for supervised cardiac rehab, ask your physician.

## 2019-03-27 DIAGNOSIS — E7800 Pure hypercholesterolemia, unspecified: Secondary | ICD-10-CM | POA: Diagnosis not present

## 2019-03-27 DIAGNOSIS — E118 Type 2 diabetes mellitus with unspecified complications: Secondary | ICD-10-CM | POA: Diagnosis not present

## 2019-03-27 DIAGNOSIS — K219 Gastro-esophageal reflux disease without esophagitis: Secondary | ICD-10-CM | POA: Diagnosis not present

## 2019-03-27 DIAGNOSIS — R9431 Abnormal electrocardiogram [ECG] [EKG]: Secondary | ICD-10-CM | POA: Diagnosis not present

## 2019-03-27 DIAGNOSIS — D509 Iron deficiency anemia, unspecified: Secondary | ICD-10-CM | POA: Diagnosis not present

## 2019-03-27 DIAGNOSIS — I4892 Unspecified atrial flutter: Secondary | ICD-10-CM | POA: Diagnosis not present

## 2019-03-27 DIAGNOSIS — Z713 Dietary counseling and surveillance: Secondary | ICD-10-CM | POA: Diagnosis not present

## 2019-03-27 DIAGNOSIS — M159 Polyosteoarthritis, unspecified: Secondary | ICD-10-CM | POA: Diagnosis not present

## 2019-03-27 DIAGNOSIS — M549 Dorsalgia, unspecified: Secondary | ICD-10-CM | POA: Diagnosis not present

## 2019-03-27 DIAGNOSIS — I10 Essential (primary) hypertension: Secondary | ICD-10-CM | POA: Diagnosis not present

## 2019-03-27 DIAGNOSIS — G4700 Insomnia, unspecified: Secondary | ICD-10-CM | POA: Diagnosis not present

## 2019-04-24 DIAGNOSIS — I4892 Unspecified atrial flutter: Secondary | ICD-10-CM | POA: Diagnosis not present

## 2019-04-24 DIAGNOSIS — R600 Localized edema: Secondary | ICD-10-CM | POA: Diagnosis not present

## 2019-04-24 DIAGNOSIS — R9431 Abnormal electrocardiogram [ECG] [EKG]: Secondary | ICD-10-CM | POA: Diagnosis not present

## 2019-04-24 DIAGNOSIS — E669 Obesity, unspecified: Secondary | ICD-10-CM | POA: Diagnosis not present

## 2019-04-24 DIAGNOSIS — M549 Dorsalgia, unspecified: Secondary | ICD-10-CM | POA: Diagnosis not present

## 2019-04-24 DIAGNOSIS — M159 Polyosteoarthritis, unspecified: Secondary | ICD-10-CM | POA: Diagnosis not present

## 2019-04-24 DIAGNOSIS — K219 Gastro-esophageal reflux disease without esophagitis: Secondary | ICD-10-CM | POA: Diagnosis not present

## 2019-04-24 DIAGNOSIS — G4700 Insomnia, unspecified: Secondary | ICD-10-CM | POA: Diagnosis not present

## 2019-04-24 DIAGNOSIS — D509 Iron deficiency anemia, unspecified: Secondary | ICD-10-CM | POA: Diagnosis not present

## 2019-04-26 ENCOUNTER — Telehealth: Payer: Self-pay

## 2019-04-26 NOTE — Telephone Encounter (Signed)
Patient advised that Dr Bettina Gavia has reviewed office note from Dr Truman Hayward on 04-24-19.  Patient instructed that he should come into office for EKG as he is on amiodarone per Dr Bettina Gavia in July.  Appointment made for 05-20-2019.  Patient agreed to plan and verbalized understanding.

## 2019-04-26 NOTE — Telephone Encounter (Signed)
-----   Message from Richardo Priest, MD sent at 04/26/2019  9:33 AM EDT ----- He does need to be seen in the office he would prefer aspirin and listed in July or August

## 2019-05-19 NOTE — Progress Notes (Signed)
Cardiology Office Note:    Date:  05/20/2019   ID:  JAMARCUS LADUKE, DOB [birth year], MRN [id number]  PCP:  Cher Nakai, MD  Cardiologist:  Shirlee More, MD    Referring MD: Cher Nakai, MD    ASSESSMENT:    1. PAF (paroxysmal atrial fibrillation) ([place])   2. Coronary artery disease involving native coronary artery of native heart with angina pectoris ([place])   3. Chronic diastolic heart failure ([place])   4. Hypertensive heart disease with heart failure ([place])   5. Mixed hyperlipidemia   6. On amiodarone therapy   7. CKD (chronic kidney disease) stage 3, GFR 30-59 ml/min (HCC)   8. Chronic obstructive pulmonary disease, unspecified COPD type ([place])    PLAN:    In order of problems listed above:  1. Atrial fibrillation stable maintaining sinus rhythm continue low-dose amiodarone his PCP follows labs every 6 months and I will remind Dr. Truman Hayward to check a TSH with his CMP.  He is not anticoagulated. 2. CAD stable continue medical therapy aspirin beta-blocker statin [place] Heart Association class I continue current treatment at this time I would not perform an ischemia evaluation 3. Heart failure New [place] Heart Association class I stable caution about eating outside of his home he will take an extra dose of diuretic today as he had seafood yesterday is up a few pounds continue his current loop diuretic 4. Hypertensive heart disease stable continue current treatment beta-blocker diuretic 5. Hyperlipidemia stable continue his high intensity statin lipids are ideal with high risk CABG 6. Amiodarone therapy stable continue low-dose labs checked with his PCP remind Dr. Truman Hayward to check TSH with next labs 7. CKD stable recent labs 8. COPD stable under good control managed by his PCP 9. Type 2 diabetes stable with additional agent as needed SGLT2 inhibitor would be ideal   Next appointment: 6 months   Medication Adjustments/Labs and Tests Ordered: Current medicines are reviewed at length with the patient  today.  Concerns regarding medicines are outlined above.  No orders of the defined types were placed in this encounter.  No orders of the defined types were placed in this encounter.   Chief Complaint  Patient presents with  . Follow-up  . Atrial Fibrillation    on amiodarone  . Congestive Heart Failure  . Coronary Artery Disease    History of Present Illness:    Antonio Snyder is a 78 y.o. male with a hx of CAD CABG EF 45-50% PAF on amiodarone dyslipidemia atrial flutter and heart failure  last seen 03/01/2019 . Compliance with diet, lifestyle and medications: Yes supervised by his wife  Overall is done well but at times his weight waxes and wanes will eat outside of the home.  He has no edema shortness of breath orthopnea chest pain palpitation or syncope.  He has good healthcare literacy wears a mask practices social distance and really does not go out in public except for medical visits.  Most recent labs 03/27/2019 cholesterol 130 HDL 56 LDL at goal 59 A1c mildly elevated 7.3% hemoglobin 12.2 stable creatinine 1.57 potassium 4.2 Past Medical History:  Diagnosis Date  . AKI (acute kidney injury) ([place]) 11/18/2016  . Atrial flutter ([place]) 11/17/2016  . CAD in native artery 04/19/2016   03/2008 CABG LIMA to LAD, SVG DIAG, OM1, OM2  . Chronic diastolic heart failure ([place]) 04/19/2016  . COPD (chronic obstructive pulmonary disease) ([place]) 07/25/2017  . DM (diabetes mellitus) ([place]) 07/25/2017  . Dyslipidemia 01/29/2018  .  Essential hypertension 04/19/2016  . 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 Hyperlipidemia 04/19/2016  . Hypertensive heart disease with heart failure ([employer]) 04/19/2016  . Hypoxemia 11/21/2016  . Lactic acidosis 08/20/2016  . Left upper lobe pneumonia ([place]) 08/20/2016  . On amiodarone therapy 04/19/2016  . PAF (paroxysmal atrial fibrillation) ([place]) 04/19/2016    Past Surgical History:  Procedure Laterality Date  . BACK SURGERY    . CORONARY ARTERY BYPASS GRAFT    .  ELECTROPHYSIOLOGIC STUDY     atrial tach/flutter  . TOTAL HIP REVISION      Current Medications: Current Meds  Medication Sig  . amiodarone (PACERONE) 200 MG tablet Take 1 tablet (200 mg total) by mouth daily.  . Ascorbic Acid (VITAMIN C) 1000 MG tablet Take 1,000 mg by mouth daily.  Marland Kitchen aspirin EC 81 MG tablet Take 81 mg by mouth daily.  Marland Kitchen atorvastatin (LIPITOR) 40 MG tablet Take 1 tablet (40 mg total) by mouth daily.  Marland Kitchen glimepiride (AMARYL) 4 MG tablet Take 4 mg by mouth as directed. Take 1 tablet (4mg ) in the morning and 0.5 tablet (2 mg) in the evening  . metFORMIN (GLUCOPHAGE) 850 MG tablet Take 850 mg by mouth 2 (two) times daily with a meal.   . metoprolol tartrate (LOPRESSOR) 100 MG tablet Take 0.5 tablets (50 mg total) by mouth daily.  . pantoprazole (PROTONIX) 40 MG tablet Take 40 mg by mouth 2 (two) times daily.   Marland Kitchen torsemide (DEMADEX) 20 MG tablet TAKE 1 TAB ON TUESDAY, THURS, SAT, SUN. TAKE 2 TABS (40 MG) IN THE MORNING AND 1 TAB IN THE AFTERNOON ON MON, WED AND FRIDAY  . VITAMIN K PO Take 5,000 Units by mouth daily at 2 PM.     Allergies:   Patient has no known allergies.   Social History   Socioeconomic History  . Marital status: Married    Spouse name: Not on file  . Number of children: Not on file  . Years of education: Not on file  . Highest education level: Not on file  Occupational History  . Not on file  Social Needs  . Financial resource strain: Not on file  . Food insecurity    Worry: Not on file    Inability: Not on file  . Transportation needs    Medical: Not on file    Non-medical: Not on file  Tobacco Use  . Smoking status: Never Smoker  . Smokeless tobacco: Never Used  Substance and Sexual Activity  . Alcohol use: Never    Frequency: Never  . Drug use: Never  . Sexual activity: Not on file  Lifestyle  . Physical activity    Days per week: Not on file    Minutes per session: Not on file  . Stress: Not on file  Relationships  . Social  Herbalist on phone: Not on file    Gets together: Not on file    Attends religious service: Not on file    Active member of club or organization: Not on file    Attends meetings of clubs or organizations: Not on file    Relationship status: Not on file  Other Topics Concern  . Not on file  Social History Narrative  . Not on file     Family History: The patient's family history includes Congestive Heart Failure in his mother; Heart attack in his father. ROS:   Please see the history of present  illness.    All other systems reviewed and are negative.  EKGs/Labs/Other Studies Reviewed:    The following studies were reviewed today:  EKG:  EKG ordered today and personally reviewed.  The ekg ordered today demonstrates sinus rhythm normal EKG  Recent Labs: 06/20/2018: ALT 29; BUN 29; Creatinine, Ser 1.43; NT-Pro BNP 1,364; Potassium 4.8; Sodium 142; TSH 0.743  Recent Lipid Panel   Physical Exam:    VS:  BP 124/78 (BP Location: Left Arm, Patient Position: Sitting, Cuff Size: Normal)   Pulse 68   Ht 5\' 4"  (1.626 m)   Wt 200 lb (90.7 kg)   SpO2 90%   BMI 34.33 kg/m     Wt Readings from Last 3 Encounters:  05/20/19 200 lb (90.7 kg)  03/01/19 188 lb 3.2 oz (85.4 kg)  11/01/18 193 lb 9.6 oz (87.8 kg)     GEN:  Well nourished, well developed in no acute distress HEENT: Normal NECK: No JVD; No carotid bruits LYMPHATICS: No lymphadenopathy CARDIAC: RRR, no murmurs, rubs, gallops RESPIRATORY:  Clear to auscultation without rales, wheezing or rhonchi  ABDOMEN: Soft, non-tender, non-distended MUSCULOSKELETAL:  No edema; No deformity  SKIN: Warm and dry NEUROLOGIC:  Alert and oriented x 3 PSYCHIATRIC:  Normal affect    Signed, Shirlee More, MD  05/20/2019 8:49 AM    [place]

## 2019-05-20 ENCOUNTER — Ambulatory Visit (INDEPENDENT_AMBULATORY_CARE_PROVIDER_SITE_OTHER): Payer: Medicare HMO | Admitting: Cardiology

## 2019-05-20 ENCOUNTER — Other Ambulatory Visit: Payer: Self-pay

## 2019-05-20 ENCOUNTER — Encounter: Payer: Self-pay | Admitting: Cardiology

## 2019-05-20 VITALS — BP 124/78 | HR 68 | Ht 64.0 in | Wt 200.0 lb

## 2019-05-20 DIAGNOSIS — I480 Paroxysmal atrial fibrillation: Secondary | ICD-10-CM

## 2019-05-20 DIAGNOSIS — E782 Mixed hyperlipidemia: Secondary | ICD-10-CM | POA: Diagnosis not present

## 2019-05-20 DIAGNOSIS — I110 Hypertensive heart disease with heart failure: Secondary | ICD-10-CM | POA: Diagnosis not present

## 2019-05-20 DIAGNOSIS — I5032 Chronic diastolic (congestive) heart failure: Secondary | ICD-10-CM

## 2019-05-20 DIAGNOSIS — N183 Chronic kidney disease, stage 3 unspecified: Secondary | ICD-10-CM

## 2019-05-20 DIAGNOSIS — Z79899 Other long term (current) drug therapy: Secondary | ICD-10-CM | POA: Diagnosis not present

## 2019-05-20 DIAGNOSIS — I25119 Atherosclerotic heart disease of native coronary artery with unspecified angina pectoris: Secondary | ICD-10-CM | POA: Diagnosis not present

## 2019-05-20 DIAGNOSIS — J449 Chronic obstructive pulmonary disease, unspecified: Secondary | ICD-10-CM | POA: Diagnosis not present

## 2019-05-20 NOTE — Patient Instructions (Addendum)
Medication Instructions:  Your physician recommends that you continue on your current medications as directed. Please refer to the Current Medication list given to you today.  If you need a refill on your cardiac medications before your next appointment, please call your pharmacy.   Lab work: None  If you have labs (blood work) drawn today and your tests are completely normal, you will receive your results only by: Marland Kitchen MyChart Message (if you have MyChart) OR . A paper copy in the mail If you have any lab test that is abnormal or we need to change your treatment, we will call you to review the results.  Testing/Procedures: You had an EKG today.   A chest x-ray takes a picture of the organs and structures inside the chest, including the heart, lungs, and blood vessels. This test can show several things, including, whether the heart is enlarges; whether fluid is building up in the lungs; and whether pacemaker / defibrillator leads are still in place. This will be done at [facility] in August 2020. Please go to the outpatient center, no appointment needed.   Follow-Up: At [facility], you and your health needs are our priority.  As part of our continuing mission to provide you with exceptional heart care, we have created designated Provider Care Teams.  These Care Teams include your primary Cardiologist (physician) and Advanced Practice Providers (APPs -  Physician Assistants and Nurse Practitioners) who all work together to provide you with the care you need, when you need it. You will need a follow up appointment in 6 months.

## 2019-05-24 DIAGNOSIS — G4700 Insomnia, unspecified: Secondary | ICD-10-CM | POA: Diagnosis not present

## 2019-05-24 DIAGNOSIS — I4892 Unspecified atrial flutter: Secondary | ICD-10-CM | POA: Diagnosis not present

## 2019-05-24 DIAGNOSIS — D509 Iron deficiency anemia, unspecified: Secondary | ICD-10-CM | POA: Diagnosis not present

## 2019-05-24 DIAGNOSIS — M159 Polyosteoarthritis, unspecified: Secondary | ICD-10-CM | POA: Diagnosis not present

## 2019-05-24 DIAGNOSIS — G8929 Other chronic pain: Secondary | ICD-10-CM | POA: Diagnosis not present

## 2019-05-24 DIAGNOSIS — M549 Dorsalgia, unspecified: Secondary | ICD-10-CM | POA: Diagnosis not present

## 2019-05-24 DIAGNOSIS — R9431 Abnormal electrocardiogram [ECG] [EKG]: Secondary | ICD-10-CM | POA: Diagnosis not present

## 2019-05-24 DIAGNOSIS — K219 Gastro-esophageal reflux disease without esophagitis: Secondary | ICD-10-CM | POA: Diagnosis not present

## 2019-05-24 DIAGNOSIS — R600 Localized edema: Secondary | ICD-10-CM | POA: Diagnosis not present

## 2019-05-29 ENCOUNTER — Other Ambulatory Visit: Payer: Self-pay | Admitting: Cardiology

## 2019-06-24 DIAGNOSIS — D509 Iron deficiency anemia, unspecified: Secondary | ICD-10-CM | POA: Diagnosis not present

## 2019-06-24 DIAGNOSIS — M159 Polyosteoarthritis, unspecified: Secondary | ICD-10-CM | POA: Diagnosis not present

## 2019-06-24 DIAGNOSIS — R7989 Other specified abnormal findings of blood chemistry: Secondary | ICD-10-CM | POA: Diagnosis not present

## 2019-06-24 DIAGNOSIS — G4700 Insomnia, unspecified: Secondary | ICD-10-CM | POA: Diagnosis not present

## 2019-06-24 DIAGNOSIS — I4892 Unspecified atrial flutter: Secondary | ICD-10-CM | POA: Diagnosis not present

## 2019-06-24 DIAGNOSIS — E118 Type 2 diabetes mellitus with unspecified complications: Secondary | ICD-10-CM | POA: Diagnosis not present

## 2019-06-24 DIAGNOSIS — M549 Dorsalgia, unspecified: Secondary | ICD-10-CM | POA: Diagnosis not present

## 2019-06-24 DIAGNOSIS — R600 Localized edema: Secondary | ICD-10-CM | POA: Diagnosis not present

## 2019-06-24 DIAGNOSIS — K219 Gastro-esophageal reflux disease without esophagitis: Secondary | ICD-10-CM | POA: Diagnosis not present

## 2019-06-28 ENCOUNTER — Telehealth: Payer: Self-pay | Admitting: Cardiology

## 2019-07-02 DIAGNOSIS — E162 Hypoglycemia, unspecified: Secondary | ICD-10-CM | POA: Diagnosis not present

## 2019-07-02 DIAGNOSIS — I4892 Unspecified atrial flutter: Secondary | ICD-10-CM | POA: Diagnosis not present

## 2019-07-02 DIAGNOSIS — R600 Localized edema: Secondary | ICD-10-CM | POA: Diagnosis not present

## 2019-07-02 DIAGNOSIS — G4700 Insomnia, unspecified: Secondary | ICD-10-CM | POA: Diagnosis not present

## 2019-07-02 DIAGNOSIS — R7989 Other specified abnormal findings of blood chemistry: Secondary | ICD-10-CM | POA: Diagnosis not present

## 2019-07-02 DIAGNOSIS — E118 Type 2 diabetes mellitus with unspecified complications: Secondary | ICD-10-CM | POA: Diagnosis not present

## 2019-07-02 DIAGNOSIS — M159 Polyosteoarthritis, unspecified: Secondary | ICD-10-CM | POA: Diagnosis not present

## 2019-07-02 DIAGNOSIS — K219 Gastro-esophageal reflux disease without esophagitis: Secondary | ICD-10-CM | POA: Diagnosis not present

## 2019-07-02 DIAGNOSIS — D509 Iron deficiency anemia, unspecified: Secondary | ICD-10-CM | POA: Diagnosis not present

## 2019-07-02 NOTE — Telephone Encounter (Signed)
°*  STAT* If patient is at the pharmacy, call can be transferred to refill team.   1. Which medications need to be refilled? (please list name of each medication and dose if known) Atorvastatin  2. Which pharmacy/location (including street and city if local pharmacy) is medication to be sent to? [facility]  3. Do they need a 30 day or 90 day supply? [place]

## 2019-07-03 DIAGNOSIS — E118 Type 2 diabetes mellitus with unspecified complications: Secondary | ICD-10-CM | POA: Diagnosis not present

## 2019-07-03 DIAGNOSIS — M159 Polyosteoarthritis, unspecified: Secondary | ICD-10-CM | POA: Diagnosis not present

## 2019-07-03 DIAGNOSIS — E162 Hypoglycemia, unspecified: Secondary | ICD-10-CM | POA: Diagnosis not present

## 2019-07-03 DIAGNOSIS — K219 Gastro-esophageal reflux disease without esophagitis: Secondary | ICD-10-CM | POA: Diagnosis not present

## 2019-07-03 DIAGNOSIS — D509 Iron deficiency anemia, unspecified: Secondary | ICD-10-CM | POA: Diagnosis not present

## 2019-07-03 DIAGNOSIS — R7989 Other specified abnormal findings of blood chemistry: Secondary | ICD-10-CM | POA: Diagnosis not present

## 2019-07-03 DIAGNOSIS — R600 Localized edema: Secondary | ICD-10-CM | POA: Diagnosis not present

## 2019-07-03 DIAGNOSIS — I4892 Unspecified atrial flutter: Secondary | ICD-10-CM | POA: Diagnosis not present

## 2019-07-03 DIAGNOSIS — G4700 Insomnia, unspecified: Secondary | ICD-10-CM | POA: Diagnosis not present

## 2019-07-03 MED ORDER — ATORVASTATIN CALCIUM 40 MG PO TABS: 40.0000 mg | ORAL_TABLET | Freq: Every day | ORAL | 1 refills | Status: DC

## 2019-07-03 NOTE — Addendum Note (Signed)
Addended by: Austin Miles on: 07/03/2019 11:02 AM   Modules accepted: Orders

## 2019-07-03 NOTE — Telephone Encounter (Signed)
Refill for atorvastatin sent to [place] as requested.

## 2019-07-08 DIAGNOSIS — K219 Gastro-esophageal reflux disease without esophagitis: Secondary | ICD-10-CM | POA: Diagnosis not present

## 2019-07-08 DIAGNOSIS — R600 Localized edema: Secondary | ICD-10-CM | POA: Diagnosis not present

## 2019-07-08 DIAGNOSIS — E669 Obesity, unspecified: Secondary | ICD-10-CM | POA: Diagnosis not present

## 2019-07-08 DIAGNOSIS — I4892 Unspecified atrial flutter: Secondary | ICD-10-CM | POA: Diagnosis not present

## 2019-07-08 DIAGNOSIS — J42 Unspecified chronic bronchitis: Secondary | ICD-10-CM | POA: Diagnosis not present

## 2019-07-08 DIAGNOSIS — R7989 Other specified abnormal findings of blood chemistry: Secondary | ICD-10-CM | POA: Diagnosis not present

## 2019-07-08 DIAGNOSIS — M159 Polyosteoarthritis, unspecified: Secondary | ICD-10-CM | POA: Diagnosis not present

## 2019-07-08 DIAGNOSIS — J984 Other disorders of lung: Secondary | ICD-10-CM | POA: Diagnosis not present

## 2019-07-08 DIAGNOSIS — Z79899 Other long term (current) drug therapy: Secondary | ICD-10-CM | POA: Diagnosis not present

## 2019-07-08 DIAGNOSIS — E118 Type 2 diabetes mellitus with unspecified complications: Secondary | ICD-10-CM | POA: Diagnosis not present

## 2019-07-08 DIAGNOSIS — D509 Iron deficiency anemia, unspecified: Secondary | ICD-10-CM | POA: Diagnosis not present

## 2019-07-08 DIAGNOSIS — G4700 Insomnia, unspecified: Secondary | ICD-10-CM | POA: Diagnosis not present

## 2019-07-12 ENCOUNTER — Telehealth: Payer: Self-pay | Admitting: *Deleted

## 2019-07-12 NOTE — Telephone Encounter (Signed)
Patient informed of chest x-ray results performed at [facility] on 07/08/2019. Patient verbalized understanding. No further questions.

## 2019-07-18 ENCOUNTER — Telehealth: Payer: Medicare HMO | Admitting: Cardiology

## 2019-07-29 DIAGNOSIS — E118 Type 2 diabetes mellitus with unspecified complications: Secondary | ICD-10-CM | POA: Diagnosis not present

## 2019-07-29 DIAGNOSIS — I10 Essential (primary) hypertension: Secondary | ICD-10-CM | POA: Diagnosis not present

## 2019-07-29 DIAGNOSIS — I4892 Unspecified atrial flutter: Secondary | ICD-10-CM | POA: Diagnosis not present

## 2019-07-29 DIAGNOSIS — E7800 Pure hypercholesterolemia, unspecified: Secondary | ICD-10-CM | POA: Diagnosis not present

## 2019-07-29 DIAGNOSIS — R600 Localized edema: Secondary | ICD-10-CM | POA: Diagnosis not present

## 2019-07-29 DIAGNOSIS — Z23 Encounter for immunization: Secondary | ICD-10-CM | POA: Diagnosis not present

## 2019-07-29 DIAGNOSIS — M159 Polyosteoarthritis, unspecified: Secondary | ICD-10-CM | POA: Diagnosis not present

## 2019-07-29 DIAGNOSIS — D509 Iron deficiency anemia, unspecified: Secondary | ICD-10-CM | POA: Diagnosis not present

## 2019-07-29 DIAGNOSIS — K219 Gastro-esophageal reflux disease without esophagitis: Secondary | ICD-10-CM | POA: Diagnosis not present

## 2019-08-07 DIAGNOSIS — G4700 Insomnia, unspecified: Secondary | ICD-10-CM | POA: Diagnosis not present

## 2019-08-07 DIAGNOSIS — M159 Polyosteoarthritis, unspecified: Secondary | ICD-10-CM | POA: Diagnosis not present

## 2019-08-07 DIAGNOSIS — R600 Localized edema: Secondary | ICD-10-CM | POA: Diagnosis not present

## 2019-08-07 DIAGNOSIS — N189 Chronic kidney disease, unspecified: Secondary | ICD-10-CM | POA: Diagnosis not present

## 2019-08-07 DIAGNOSIS — K219 Gastro-esophageal reflux disease without esophagitis: Secondary | ICD-10-CM | POA: Diagnosis not present

## 2019-08-07 DIAGNOSIS — I4892 Unspecified atrial flutter: Secondary | ICD-10-CM | POA: Diagnosis not present

## 2019-08-07 DIAGNOSIS — D509 Iron deficiency anemia, unspecified: Secondary | ICD-10-CM | POA: Diagnosis not present

## 2019-08-07 DIAGNOSIS — I10 Essential (primary) hypertension: Secondary | ICD-10-CM | POA: Diagnosis not present

## 2019-08-07 DIAGNOSIS — E118 Type 2 diabetes mellitus with unspecified complications: Secondary | ICD-10-CM | POA: Diagnosis not present

## 2019-08-12 ENCOUNTER — Other Ambulatory Visit: Payer: Self-pay | Admitting: Cardiology

## 2019-08-19 ENCOUNTER — Other Ambulatory Visit: Payer: Self-pay | Admitting: Cardiology

## 2019-08-27 DIAGNOSIS — R600 Localized edema: Secondary | ICD-10-CM | POA: Diagnosis not present

## 2019-08-27 DIAGNOSIS — N189 Chronic kidney disease, unspecified: Secondary | ICD-10-CM | POA: Diagnosis not present

## 2019-08-27 DIAGNOSIS — G4700 Insomnia, unspecified: Secondary | ICD-10-CM | POA: Diagnosis not present

## 2019-08-27 DIAGNOSIS — E118 Type 2 diabetes mellitus with unspecified complications: Secondary | ICD-10-CM | POA: Diagnosis not present

## 2019-08-27 DIAGNOSIS — K219 Gastro-esophageal reflux disease without esophagitis: Secondary | ICD-10-CM | POA: Diagnosis not present

## 2019-08-27 DIAGNOSIS — I10 Essential (primary) hypertension: Secondary | ICD-10-CM | POA: Diagnosis not present

## 2019-08-27 DIAGNOSIS — M159 Polyosteoarthritis, unspecified: Secondary | ICD-10-CM | POA: Diagnosis not present

## 2019-08-27 DIAGNOSIS — D509 Iron deficiency anemia, unspecified: Secondary | ICD-10-CM | POA: Diagnosis not present

## 2019-08-27 DIAGNOSIS — I4892 Unspecified atrial flutter: Secondary | ICD-10-CM | POA: Diagnosis not present

## 2019-09-20 DIAGNOSIS — E118 Type 2 diabetes mellitus with unspecified complications: Secondary | ICD-10-CM | POA: Diagnosis not present

## 2019-09-20 DIAGNOSIS — I10 Essential (primary) hypertension: Secondary | ICD-10-CM | POA: Diagnosis not present

## 2019-09-20 DIAGNOSIS — M159 Polyosteoarthritis, unspecified: Secondary | ICD-10-CM | POA: Diagnosis not present

## 2019-09-20 DIAGNOSIS — Z20828 Contact with and (suspected) exposure to other viral communicable diseases: Secondary | ICD-10-CM | POA: Diagnosis not present

## 2019-09-20 DIAGNOSIS — G4700 Insomnia, unspecified: Secondary | ICD-10-CM | POA: Diagnosis not present

## 2019-09-20 DIAGNOSIS — D509 Iron deficiency anemia, unspecified: Secondary | ICD-10-CM | POA: Diagnosis not present

## 2019-09-20 DIAGNOSIS — K219 Gastro-esophageal reflux disease without esophagitis: Secondary | ICD-10-CM | POA: Diagnosis not present

## 2019-09-20 DIAGNOSIS — R600 Localized edema: Secondary | ICD-10-CM | POA: Diagnosis not present

## 2019-09-20 DIAGNOSIS — I4892 Unspecified atrial flutter: Secondary | ICD-10-CM | POA: Diagnosis not present

## 2019-10-05 DIAGNOSIS — K219 Gastro-esophageal reflux disease without esophagitis: Secondary | ICD-10-CM | POA: Diagnosis not present

## 2019-10-05 DIAGNOSIS — R9431 Abnormal electrocardiogram [ECG] [EKG]: Secondary | ICD-10-CM | POA: Diagnosis not present

## 2019-10-05 DIAGNOSIS — E7800 Pure hypercholesterolemia, unspecified: Secondary | ICD-10-CM | POA: Diagnosis not present

## 2019-10-05 DIAGNOSIS — E118 Type 2 diabetes mellitus with unspecified complications: Secondary | ICD-10-CM | POA: Diagnosis not present

## 2019-10-05 DIAGNOSIS — M159 Polyosteoarthritis, unspecified: Secondary | ICD-10-CM | POA: Diagnosis not present

## 2019-10-05 DIAGNOSIS — I4892 Unspecified atrial flutter: Secondary | ICD-10-CM | POA: Diagnosis not present

## 2019-10-05 DIAGNOSIS — G4700 Insomnia, unspecified: Secondary | ICD-10-CM | POA: Diagnosis not present

## 2019-10-05 DIAGNOSIS — I10 Essential (primary) hypertension: Secondary | ICD-10-CM | POA: Diagnosis not present

## 2019-10-05 DIAGNOSIS — R600 Localized edema: Secondary | ICD-10-CM | POA: Diagnosis not present

## 2019-10-05 DIAGNOSIS — D509 Iron deficiency anemia, unspecified: Secondary | ICD-10-CM | POA: Diagnosis not present

## 2019-10-18 ENCOUNTER — Telehealth: Payer: Self-pay | Admitting: Cardiology

## 2019-10-18 DIAGNOSIS — I25119 Atherosclerotic heart disease of native coronary artery with unspecified angina pectoris: Secondary | ICD-10-CM

## 2019-10-18 DIAGNOSIS — I110 Hypertensive heart disease with heart failure: Secondary | ICD-10-CM

## 2019-10-18 DIAGNOSIS — I480 Paroxysmal atrial fibrillation: Secondary | ICD-10-CM

## 2019-10-18 DIAGNOSIS — I5032 Chronic diastolic (congestive) heart failure: Secondary | ICD-10-CM

## 2019-10-18 MED ORDER — METOLAZONE 5 MG PO TABS: 5.0000 mg | ORAL_TABLET | ORAL | 0 refills | Status: DC

## 2019-10-18 NOTE — Telephone Encounter (Signed)
Patient has been experiencing weight gain for the past 2 weeks. His weight 2 weeks ago was 200 pounds and today he weighed in at 212 pounds. Patient has been having shortness of breath on exertion and at rest. His wife, Pamala Hurry, per [facility] also reports bilateral lower extremity edema. Patient wears support hose daily and this normally improves edema but it has not resolved this time. He has been limiting his sodium intake and elevating his legs as much as possible. Patient is taking torsemide as prescribed. Please advise of further recommendations. Thanks!

## 2019-10-18 NOTE — Telephone Encounter (Signed)
Patient is gaining a lot of weight gan and fluid pill is not owrking and wife is very concerned.

## 2019-10-18 NOTE — Telephone Encounter (Signed)
Spoke with patient's wife, Pamala Hurry, and informed her of Dr. Joya Gaskins advisement. Pamala Hurry verbalized understanding and is agreeable to plan. Prescription for zaroxolyn 5 mg has been sent to [facility] in [place] as requested. Patient will come to the [place] office for lab work on Monday, 10/21/2019. No appointment needed, no need to fast beforehand. No further questions.

## 2019-10-18 NOTE — Addendum Note (Signed)
Addended by: Austin Miles on: 10/18/2019 05:13 PM   Modules accepted: Orders

## 2019-10-18 NOTE — Telephone Encounter (Signed)
I like him to take Zaroxolyn 5 mg tomorrow and the next day 45 minutes prior to the dose of torsemide and need to come the office on Monday BMP and proBNP and if he worsens go to the emergency room but I would really like to try to avoid that especially during COVID-19.  If he needs to go to the emergency room I would recommend the med center [facility] this weekend

## 2019-10-19 DIAGNOSIS — I4892 Unspecified atrial flutter: Secondary | ICD-10-CM | POA: Diagnosis not present

## 2019-10-19 DIAGNOSIS — M159 Polyosteoarthritis, unspecified: Secondary | ICD-10-CM | POA: Diagnosis not present

## 2019-10-19 DIAGNOSIS — D509 Iron deficiency anemia, unspecified: Secondary | ICD-10-CM | POA: Diagnosis not present

## 2019-10-19 DIAGNOSIS — G4700 Insomnia, unspecified: Secondary | ICD-10-CM | POA: Diagnosis not present

## 2019-10-19 DIAGNOSIS — R600 Localized edema: Secondary | ICD-10-CM | POA: Diagnosis not present

## 2019-10-19 DIAGNOSIS — R9431 Abnormal electrocardiogram [ECG] [EKG]: Secondary | ICD-10-CM | POA: Diagnosis not present

## 2019-10-19 DIAGNOSIS — K219 Gastro-esophageal reflux disease without esophagitis: Secondary | ICD-10-CM | POA: Diagnosis not present

## 2019-10-19 DIAGNOSIS — N1832 Chronic kidney disease, stage 3b: Secondary | ICD-10-CM | POA: Diagnosis not present

## 2019-10-19 DIAGNOSIS — R7989 Other specified abnormal findings of blood chemistry: Secondary | ICD-10-CM | POA: Diagnosis not present

## 2019-10-21 DIAGNOSIS — I480 Paroxysmal atrial fibrillation: Secondary | ICD-10-CM | POA: Diagnosis not present

## 2019-10-21 DIAGNOSIS — I110 Hypertensive heart disease with heart failure: Secondary | ICD-10-CM | POA: Diagnosis not present

## 2019-10-21 DIAGNOSIS — I5032 Chronic diastolic (congestive) heart failure: Secondary | ICD-10-CM | POA: Diagnosis not present

## 2019-10-21 DIAGNOSIS — I25119 Atherosclerotic heart disease of native coronary artery with unspecified angina pectoris: Secondary | ICD-10-CM | POA: Diagnosis not present

## 2019-10-22 LAB — BASIC METABOLIC PANEL
BUN/Creatinine Ratio: 20 (ref 10–24)
BUN: 50 mg/dL — ABNORMAL HIGH (ref 8–27)
CO2: 23 mmol/L (ref 20–29)
Calcium: 9.6 mg/dL (ref 8.6–10.2)
Chloride: 97 mmol/L (ref 96–106)
Creatinine, Ser: 2.56 mg/dL — ABNORMAL HIGH (ref 0.76–1.27)
GFR calc Af Amer: 27 mL/min/{1.73_m2} — ABNORMAL LOW (ref >59)
GFR calc non Af Amer: 23 mL/min/{1.73_m2} — ABNORMAL LOW (ref >59)
Glucose: 73 mg/dL (ref 65–99)
Potassium: 5.5 mmol/L — ABNORMAL HIGH (ref 3.5–5.2)
Sodium: 141 mmol/L (ref 134–144)

## 2019-10-22 LAB — PRO B NATRIURETIC PEPTIDE: NT-Pro BNP: 2743 pg/mL — ABNORMAL HIGH (ref 0–486)

## 2019-10-23 ENCOUNTER — Other Ambulatory Visit: Payer: Self-pay | Admitting: Cardiology

## 2019-10-23 ENCOUNTER — Telehealth: Payer: Self-pay | Admitting: *Deleted

## 2019-10-23 NOTE — Telephone Encounter (Signed)
Patient's wife, Pamala Hurry, per [facility] informed of lab results and advised that Dr. Bettina Gavia would like patient to be seen in the office by Urban Gibson, NP this week. Pamala Hurry is agreeable and patient has been scheduled to see Urban Gibson, NP tomorrow at 10:15 am in the [facility] office. Provided her with address and phone number of the location. She reports patient is no longer taking zaroxolyn and advised her to not restart this medication. Pamala Hurry verbalized understanding. No further questions.

## 2019-10-24 ENCOUNTER — Ambulatory Visit (INDEPENDENT_AMBULATORY_CARE_PROVIDER_SITE_OTHER): Payer: Medicare HMO | Admitting: Family

## 2019-10-24 ENCOUNTER — Other Ambulatory Visit: Payer: Self-pay

## 2019-10-24 ENCOUNTER — Encounter: Payer: Self-pay | Admitting: Family

## 2019-10-24 VITALS — BP 110/70 | HR 60 | Ht 64.0 in

## 2019-10-24 DIAGNOSIS — Z79899 Other long term (current) drug therapy: Secondary | ICD-10-CM | POA: Diagnosis not present

## 2019-10-24 DIAGNOSIS — N1832 Chronic kidney disease, stage 3b: Secondary | ICD-10-CM | POA: Diagnosis not present

## 2019-10-24 DIAGNOSIS — I110 Hypertensive heart disease with heart failure: Secondary | ICD-10-CM

## 2019-10-24 DIAGNOSIS — I5032 Chronic diastolic (congestive) heart failure: Secondary | ICD-10-CM | POA: Diagnosis not present

## 2019-10-24 DIAGNOSIS — I480 Paroxysmal atrial fibrillation: Secondary | ICD-10-CM | POA: Diagnosis not present

## 2019-10-24 NOTE — Patient Instructions (Signed)
Medication Instructions:  No medication changes today.  *If you need a refill on your cardiac medications before your next appointment, please call your pharmacy*  Lab Work: Your physician recommends that you return for lab work in: Monday  Stop by the [place] office Monday for BMET (checks kidney function and electrolytes including potassium). You do not need an appointment. The order for those labs has already been placed today.   If you have labs (blood work) drawn today and your tests are completely normal, you will receive your results only by: Marland Kitchen MyChart Message (if you have MyChart) OR . A paper copy in the mail If you have any lab test that is abnormal or we need to change your treatment, we will call you to review the results.  Testing/Procedures: None today.  Follow-Up: At [facility], you and your health needs are our priority.  As part of our continuing mission to provide you with exceptional heart care, we have created designated Provider Care Teams.  These Care Teams include your primary Cardiologist (physician) and Advanced Practice Providers (APPs -  Physician Assistants and Nurse Practitioners) who all work together to provide you with the care you need, when you need it.  Your next appointment:   3 week(s)  The format for your next appointment:   In Person  Provider:   Shirlee More, MD  Other Instructions  Wear compression stockings every day.  Order food with no salt at restaurants.  Sit with legs elevated.  Drink 3-4 bottles of water every day. Your urine should be clear to pale yellow. If you urine is dark yellow, you need a bit more water.

## 2019-10-24 NOTE — Progress Notes (Signed)
Office Visit    Patient Name: Antonio Snyder Date of Encounter: 10/24/2019  Primary Care Provider:  Cher Nakai, MD Primary Cardiologist:  Antonio More, MD Electrophysiologist:  None   Chief Complaint    Antonio Snyder is a 78 y.o. male with a hx of CAD, PAF on amiodarone, hypertensive heart disease with chronic diastolic heart failure, hyperlipidemia, CKD 3, COPD, DM 2 presents today for S OB, edema.  Past Medical History    Past Medical History:  Diagnosis Date  . AKI (acute kidney injury) ([place]) 11/18/2016  . Atrial flutter ([place]) 11/17/2016  . CAD in native artery 04/19/2016   03/2008 CABG LIMA to LAD, SVG DIAG, OM1, OM2  . Chronic diastolic heart failure ([place]) 04/19/2016  . COPD (chronic obstructive pulmonary disease) ([place]) 07/25/2017  . DM (diabetes mellitus) ([place]) 07/25/2017  . Dyslipidemia 01/29/2018  . Essential hypertension 04/19/2016  . 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 Hyperlipidemia 04/19/2016  . Hypertensive heart disease with heart failure ([place]) 04/19/2016  . Hypoxemia 11/21/2016  . Lactic acidosis 08/20/2016  . Left upper lobe pneumonia 08/20/2016  . On amiodarone therapy 04/19/2016  . PAF (paroxysmal atrial fibrillation) ([place]) 04/19/2016   Past Surgical History:  Procedure Laterality Date  . BACK SURGERY    . CORONARY ARTERY BYPASS GRAFT    . ELECTROPHYSIOLOGIC STUDY     atrial tach/flutter  . TOTAL HIP REVISION      Allergies  No Known Allergies  History of Present Illness    Antonio Snyder is a 78 y.o. male with a hx of CAD, PAF on amiodarone, hypertensive heart disease with chronic diastolic heart failure, hyperlipidemia, CKD 3, COPD, DM 2 last seen 05/2019 by Antonio Snyder.  Reports increased fluid retention over the last 2 weeks.  Associated with increased lower extremity edema and increased dyspnea with exertion.  He denies orthopnea, PND, shortness of breath at rest. He refused to be weighted today in the office.  He called the office 10/18/2019 with report of  weight gain of 12 pounds over the last 2 weeks. He was recommended metolazone 5 mg x 2 days.  Repeat labs 10/21/2019 after 2 doses showed proBNP 2743, K5.5, creatinine 2.56, GFR 23.  He was instructed to stop the metolazone.  Tells me he was under the impression that his sodium has been low in the past.  He is also on the depression his potassium had been low in the past and he has been supplementing by eating lots of bananas. I do not have record of a low potassium even on review from labs by his PCP.  Drinks six 16 ounce bottles of water per day which is approximately 2.8 L of fluid.  He endorses eating a low-sodium diet however he and his wife tell me that they eat out about half of the time.  Reports he does not order his food without salt.  Also endorse that they eat a lot of canned foods and tried by the low-sodium cans but have not been rinsing prior to eating.  He uses [employer] on his food but is not certain if they purchased the low-sodium kind.  He wears his compression stockings sometimes.  Elevate lower extremities when sitting in certain chairs as other chairs in the home will hurt his back as he has degenerative disc disease.  We discussed drinking enough fluid to keep urine clear to pale yellow but not over drinking as it will lead to  Snyder fluid retention.  We discussed low-sodium diet.  Asked that he asked the restaurant to cook with no salt or low salt as he noticed the restaurant well as they eat there frequently.  Recommended rinsing canned vegetables prior to eating and focusing on her frozen vegetables or fresh foods.  He sees his PCP Dr. Truman Snyder of [facility] internal medicine regularly.  He was seen December 5.  He has an upcoming appointment December 19.  Tells me he was recently on a medication for "protein "by Dr. Truman Snyder and Dr. Truman Snyder was thinking that this medication could have contributed to the decrease in kidney function but he is not certain what medication it was. Dec 5 and Dec 19.     EKGs/Labs/Other Studies Reviewed:   The following studies were reviewed today:  EKG: No EKG today.  Recent Labs: 10/21/2019: BUN 50; Creatinine, Ser 2.56; NT-Pro BNP 2,743; Potassium 5.5; Sodium 141  Recent Lipid Panel No results found for: CHOL, TRIG, HDL, CHOLHDL, VLDL, LDLCALC, LDLDIRECT  Home Medications   Current Meds  Medication Sig  . amiodarone (PACERONE) 200 MG tablet TAKE 1 TABLET (200 MG TOTAL) BY MOUTH DAILY.  Marland Kitchen Ascorbic Acid (VITAMIN C) 1000 MG tablet Take 1,000 mg by mouth daily.  Marland Kitchen aspirin EC 81 MG tablet Take 81 mg by mouth daily.  Marland Kitchen atorvastatin (LIPITOR) 40 MG tablet Take 1 tablet (40 mg total) by mouth daily.  . metFORMIN (GLUCOPHAGE) 850 MG tablet Take 850 mg by mouth 2 (two) times daily with a meal.   . metoprolol tartrate (LOPRESSOR) 100 MG tablet Take 0.5 tablets (50 mg total) by mouth daily.  . pantoprazole (PROTONIX) 40 MG tablet Take 40 mg by mouth 2 (two) times daily.   Marland Kitchen torsemide (DEMADEX) 20 MG tablet TAKE 1 TAB TUESDAY, THURSDAY, SATURDAY, SUNDAY AND 2 TABS IN THE MORNING AND 1 TAB IN AFTERNOON ON MONDAY, WEDNESDAY, FRIDAY  . VITAMIN K PO Take 5,000 Units by mouth daily at 2 PM.      Review of Systems    Review of Systems  Constitution: Negative for chills, fever and malaise/fatigue.  Cardiovascular: Positive for dyspnea on exertion and leg swelling. Negative for chest pain, orthopnea, palpitations and paroxysmal nocturnal dyspnea.  Respiratory: Positive for shortness of breath. Negative for cough and wheezing.   Musculoskeletal: Positive for back pain (chronic).  Gastrointestinal: Negative for nausea and vomiting.  Neurological: Negative for dizziness, light-headedness and weakness.   All other systems reviewed and are otherwise negative except as noted above.  Physical Exam    VS:  BP 110/70 (BP Location: Right Arm, Patient Position: Sitting, Cuff Size: Normal)   Pulse 60   Ht 5\' 4"  (1.626 m)   SpO2 (!) 89%   BMI 34.33 kg/m  , BMI Body  mass index is 34.33 kg/m. GEN: Well nourished, well developed, in no acute distress. HEENT: normal. Neck: Supple, no JVD, carotid bruits, or masses. Cardiac: RRR, no murmurs, rubs, or gallops. No clubbing, cyanosis. Bilateral LE with 2+ pitting edema to knee.  Radials/DP/PT 2+ and equal bilaterally.  Respiratory:  Respirations regular and unlabored, clear to auscultation bilaterally. GI: Soft, nontender, nondistended, BS + x 4. MS: No deformity or atrophy. Skin: Warm and dry, no rash. Neuro:  Strength and sensation are intact. Psych: Normal affect.  Assessment & Plan    1. Hypertensive heart disease with chronic diastolic heart failure - Called office last week volume overload, up 12lb over 2 week per his report. 12/5 and 12/6 took  Metolazone 5mg . Labs 12/7 with creatinine 2.56, GFR 23, ProBNP 2,743. Metolazone stopped. NYHA II with DOE and 2+ bilateral LE edema. No orthopnea, PND. Reports his O2 readings are chronically in the high 80s, likely secondary to COPD. There is noted dietary indiscretion as he endorses eating out 50% of the time, eating canned goods, and drinking close to 3L of fluid daily.  Continue present Torsemide dose.   Recheck BMP/ProBNP Monday (one week from previous).   He will wear compression stockings daily.   He will elevate his legs when sitting.   Low sodium diet. Recommend 1.5-2L of fluid per day. Educated that urine should be clear to pale yellow.  After present exacerbation resolves consider echocardiogram for reassessment of LVEF.  2. Acute kidney injury in setting of CKDIII - Likely due to Metolazone and volume overload. Per pt his PCP told him it might have been the medication he was taking for "protein" unfortunately those notes unavailable for my review. Recheck BMP and ProBNP Monday, one week from previous.   3. Hyperkalemia - K 5.5 on labs 12/5. He had been eating extra dietary potassium in form of bananas and was asked to stop.  4. PAF -  Maintaining SR. Continue amiodarone. No anticoagulated secondary to brief single episode of PAF.  5. On amiodarone therapy - PCP monitors TSH and CMP q6 months. No signs of toxicity.  6. COPD - Follows with PCP. Likely etiology of some of his dyspnea.   7. DM2 - Follows with PCP. Glimepride recently DC'd due to hypoglycemia.   Disposition: Follow up in 3 week(s) with Dr. Earney Navy, NP 10/24/2019, 1:06 PM

## 2019-11-02 DIAGNOSIS — N189 Chronic kidney disease, unspecified: Secondary | ICD-10-CM | POA: Diagnosis not present

## 2019-11-02 DIAGNOSIS — R9431 Abnormal electrocardiogram [ECG] [EKG]: Secondary | ICD-10-CM | POA: Diagnosis not present

## 2019-11-02 DIAGNOSIS — M159 Polyosteoarthritis, unspecified: Secondary | ICD-10-CM | POA: Diagnosis not present

## 2019-11-02 DIAGNOSIS — N1832 Chronic kidney disease, stage 3b: Secondary | ICD-10-CM | POA: Diagnosis not present

## 2019-11-02 DIAGNOSIS — K219 Gastro-esophageal reflux disease without esophagitis: Secondary | ICD-10-CM | POA: Diagnosis not present

## 2019-11-02 DIAGNOSIS — E118 Type 2 diabetes mellitus with unspecified complications: Secondary | ICD-10-CM | POA: Diagnosis not present

## 2019-11-02 DIAGNOSIS — D509 Iron deficiency anemia, unspecified: Secondary | ICD-10-CM | POA: Diagnosis not present

## 2019-11-02 DIAGNOSIS — R600 Localized edema: Secondary | ICD-10-CM | POA: Diagnosis not present

## 2019-11-02 DIAGNOSIS — I4892 Unspecified atrial flutter: Secondary | ICD-10-CM | POA: Diagnosis not present

## 2019-11-12 NOTE — Progress Notes (Signed)
Cardiology Office Note:    Date:  11/13/2019   ID:  Antonio Snyder, DOB 1941/04/29, MRN [id number]  PCP:  Cher Nakai, MD  Cardiologist:  Shirlee More, MD    Referring MD: Cher Nakai, MD    ASSESSMENT:    1. Chronic diastolic heart failure ([place])   2. Hypertensive heart disease with heart failure (HCC)   3. Stage 3b chronic kidney disease   4. PAF (paroxysmal atrial fibrillation) ([place])   5. On amiodarone therapy    PLAN:    In order of problems listed above:  1. He has markedly deteriorated worsened will add Zaroxolyn following his kidney function if he does not improve he will require hospitalization.  Today recheck renal function proBNP.  I favor admission to the hospital today but I understand the patient's reluctance especially during the high prevalence of COVID-19 in her community his wife is intelligent and I told her that if he is unimproved or worsened present the med center [facility] emergency room for stabilization arrangement for hospitalization at [facility].  If admitted would likely require continuous IV diuretic.  Will check labs today I do not think this is cardiorenal syndrome at this time 2. See above he is not taking any potent vasodilators and BP is at target 3. Check renal function today repeat Monday 4. Check EKG before leaving the office 5. Check thyroid test   Next appointment: Next week   Medication Adjustments/Labs and Tests Ordered: Current medicines are reviewed at length with the patient today.  Concerns regarding medicines are outlined above.  No orders of the defined types were placed in this encounter.  No orders of the defined types were placed in this encounter.   No chief complaint on file.   History of Present Illness:    Antonio Snyder is a 78 y.o. 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 Compliance with diet, lifestyle and medications: Yes  He is not doing well despite escalating his loop diuretic he tells me his renal function is worsened he has anasarca and short of breath with any activity.  He obviously has diuretic resistance we discussed hospitalization he would like to do his best to avoid it we will increase his dose of torsemide put him on metolazone and bring it back Monday if not improved or worsen he will present to the emergency room.  Fortunately is not having orthopnea chest pain or syncope.  He is on amiodarone we will check an EKG for heart rhythm while he is in the office today and recheck his thyroid studies.   Ref Range & Units 3 wk ago 1 yr ago  NT-Pro BNP 0 - 486 pg/mL 2,743High   1,364High  R,     Past Medical History:  Diagnosis Date  . AKI (acute kidney injury) ([place]) 11/18/2016  . Atrial flutter ([place]) 11/17/2016  . CAD in native artery 04/19/2016   03/2008 CABG LIMA to LAD, SVG DIAG, OM1, OM2  . Chronic diastolic heart failure ([place]) 04/19/2016  . COPD (chronic obstructive pulmonary disease) ([place]) 07/25/2017  . DM (diabetes mellitus) ([place]) 07/25/2017  . Dyslipidemia 01/29/2018  . Essential hypertension 04/19/2016  . 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 Hyperlipidemia 04/19/2016  . Hypertensive heart disease with heart failure ([place]) 04/19/2016  . Hypoxemia 11/21/2016  . Lactic  acidosis 08/20/2016  . Left upper lobe pneumonia 08/20/2016  . On amiodarone therapy 04/19/2016  . PAF (paroxysmal atrial fibrillation) ([place]) 04/19/2016    Past Surgical History:  Procedure Laterality Date  . BACK SURGERY    . CORONARY ARTERY BYPASS GRAFT    . ELECTROPHYSIOLOGIC STUDY     atrial tach/flutter  . TOTAL HIP REVISION      Current Medications: Current Meds  Medication Sig  . amiodarone (PACERONE) 200 MG tablet TAKE 1 TABLET (200 MG TOTAL) BY MOUTH DAILY.  Marland Kitchen Ascorbic Acid (VITAMIN C) 1000 MG tablet Take 1,000 mg by mouth daily.  Marland Kitchen aspirin EC 81 MG  tablet Take 81 mg by mouth daily.  Marland Kitchen atorvastatin (LIPITOR) 40 MG tablet Take 1 tablet (40 mg total) by mouth daily.  . metFORMIN (GLUCOPHAGE) 850 MG tablet Take 850 mg by mouth 2 (two) times daily with a meal.   . metoprolol tartrate (LOPRESSOR) 100 MG tablet Take 0.5 tablets (50 mg total) by mouth daily.  . nabumetone (RELAFEN) 750 MG tablet daily.  . pantoprazole (PROTONIX) 40 MG tablet Take 40 mg by mouth 2 (two) times daily.   Marland Kitchen torsemide (DEMADEX) 20 MG tablet TAKE 1 TAB TUESDAY, THURSDAY, SATURDAY, SUNDAY AND 2 TABS IN THE MORNING AND 1 TAB IN AFTERNOON ON MONDAY, WEDNESDAY, FRIDAY  . VITAMIN K PO Take 5,000 Units by mouth daily at 2 PM.  . [DISCONTINUED] glimepiride (AMARYL) 4 MG tablet Take 4 mg by mouth as directed. Take 1 tablet (4mg ) in the morning and 0.5 tablet (2 mg) in the evening     Allergies:   Patient has no known allergies.   Social History   Socioeconomic History  . Marital status: Married    Spouse name: Not on file  . Number of children: Not on file  . Years of education: Not on file  . Highest education level: Not on file  Occupational History  . Not on file  Tobacco Use  . Smoking status: Never Smoker  . Smokeless tobacco: Never Used  Substance and Sexual Activity  . Alcohol use: Never  . Drug use: Never  . Sexual activity: Not on file  Other Topics Concern  . Not on file  Social History Narrative  . Not on file   Social Determinants of Health   Financial Resource Strain:   . Difficulty of Paying Living Expenses: Not on file  Food Insecurity:   . Worried About Charity fundraiser in the Last Year: Not on file  . Ran Out of Food in the Last Year: Not on file  Transportation Needs:   . Lack of Transportation (Medical): Not on file  . Lack of Transportation (Non-Medical): Not on file  Physical Activity:   . Days of Exercise per Week: Not on file  . Minutes of Exercise per Session: Not on file  Stress:   . Feeling of Stress : Not on file  Social  Connections:   . Frequency of Communication with Friends and Family: Not on file  . Frequency of Social Gatherings with Friends and Family: Not on file  . Attends Religious Services: Not on file  . Active Member of Clubs or Organizations: Not on file  . Attends Archivist Meetings: Not on file  . Marital Status: Not on file     Family History: The patient's family history includes Congestive Heart Failure in his mother; Heart attack in his father. ROS:   Please see the history of present illness.  All other systems reviewed and are negative.  EKGs/Labs/Other Studies Reviewed:    The following studies were reviewed today:  EKG:  EKG ordered today and personally reviewed.  The ekg ordered today demonstrates sinus rhythm no ischemic changes  Recent Labs: 10/21/2019: BUN 50; Creatinine, Ser 2.56; NT-Pro BNP 2,743; Potassium 5.5; Sodium 141  Recent Lipid Panel No results found for: CHOL, TRIG, HDL, CHOLHDL, VLDL, LDLCALC, LDLDIRECT  Physical Exam:    VS:  BP 110/70   Pulse 65   Ht 5\' 4"  (1.626 m)   Wt 221 lb 3.2 oz (100.3 kg)   SpO2 93%   BMI 37.97 kg/m     Wt Readings from Last 3 Encounters:  11/13/19 221 lb 3.2 oz (100.3 kg)  05/20/19 200 lb (90.7 kg)  03/01/19 188 lb 3.2 oz (85.4 kg)     GEN: He has anasarca with total body edema well nourished, well developed in no acute distress HEENT: Normal NECK: Marked JVD with hepatojugular reflux; No carotid bruits LYMPHATICS: No lymphadenopathy CARDIAC: Soft S1 S3 present RRR, no murmurs, rubs, gallops RESPIRATORY:  Clear to auscultation without rales, wheezing or rhonchi  ABDOMEN: Soft, non-tender, non-distended MUSCULOSKELETAL: Diffuse pitting edema above the umbilicus no deformity  SKIN: Warm and dry NEUROLOGIC:  Alert and oriented x 3 PSYCHIATRIC:  Normal affect    Signed, Shirlee More, MD  11/13/2019 2:30 PM    [facility]

## 2019-11-13 ENCOUNTER — Ambulatory Visit (INDEPENDENT_AMBULATORY_CARE_PROVIDER_SITE_OTHER): Payer: Medicare HMO | Admitting: Cardiology

## 2019-11-13 ENCOUNTER — Encounter: Payer: Self-pay | Admitting: Cardiology

## 2019-11-13 ENCOUNTER — Other Ambulatory Visit: Payer: Self-pay

## 2019-11-13 VITALS — BP 110/70 | HR 65 | Ht 64.0 in | Wt 221.2 lb

## 2019-11-13 DIAGNOSIS — N1832 Chronic kidney disease, stage 3b: Secondary | ICD-10-CM | POA: Diagnosis not present

## 2019-11-13 DIAGNOSIS — I5032 Chronic diastolic (congestive) heart failure: Secondary | ICD-10-CM | POA: Diagnosis not present

## 2019-11-13 DIAGNOSIS — Z79899 Other long term (current) drug therapy: Secondary | ICD-10-CM | POA: Diagnosis not present

## 2019-11-13 DIAGNOSIS — I480 Paroxysmal atrial fibrillation: Secondary | ICD-10-CM | POA: Diagnosis not present

## 2019-11-13 DIAGNOSIS — Z1329 Encounter for screening for other suspected endocrine disorder: Secondary | ICD-10-CM

## 2019-11-13 DIAGNOSIS — I110 Hypertensive heart disease with heart failure: Secondary | ICD-10-CM

## 2019-11-13 MED ORDER — METOLAZONE 5 MG PO TABS: 5.0000 mg | ORAL_TABLET | Freq: Every day | ORAL | 3 refills | Status: DC

## 2019-11-13 MED ORDER — TORSEMIDE 20 MG PO TABS: ORAL_TABLET | ORAL | 1 refills | Status: DC

## 2019-11-13 NOTE — Patient Instructions (Addendum)
Medication Instructions:  Your physician has recommended you make the following change in your medication:   START taking torsemide 40 mg (2 tablets) twice daily START taking metolazone 5 mg (1 tablet) once every morning 51min prior to torsemide   *If you need a refill on your cardiac medications before your next appointment, please call your pharmacy*  Lab Work: Your physician recommends that you have a TSH, T4, T3, BMP and BNP drawn to day   If you have labs (blood work) drawn today and your tests are completely normal, you will receive your results only by: Marland Kitchen MyChart Message (if you have MyChart) OR . A paper copy in the mail If you have any lab test that is abnormal or we need to change your treatment, we will call you to review the results.  Testing/Procedures: You had an EKG performed today  Follow-Up: At [facility], you and your health needs are our priority.  As part of our continuing mission to provide you with exceptional heart care, we have created designated Provider Care Teams.  These Care Teams include your primary Cardiologist (physician) and Advanced Practice Providers (APPs -  Physician Assistants and Nurse Practitioners) who all work together to provide you with the care you need, when you need it.  Your next appointment:   1 week(s)  The format for your next appointment:   In Person  Provider:   Shirlee More, MD  Other Instructions Torsemide tablets What is this medicine? TORSEMIDE (TORE se mide) is a diuretic. It helps you make more urine and to lose salt and excess water from your body. This medicine is used to treat high blood pressure, and edema or swelling from heart, kidney, or liver disease. This medicine may be used for other purposes; ask your health care provider or pharmacist if you have questions. COMMON BRAND NAME(S): Demadex What should I tell my health care provider before I take this medicine? They need to know if you have any of these  conditions:  abnormal blood electrolytes  diabetes  gout  heart disease  kidney disease  liver disease  small amounts of urine, or difficulty passing urine  an unusual or allergic reaction to torsemide, sulfa drugs, other medicines, foods, dyes, or preservatives  pregnant or trying to get pregnant  breast-feeding How should I use this medicine? Take this medicine by mouth with a glass of water. Follow the directions on the prescription label. You may take this medicine with or without food. If it upsets your stomach, take it with food or milk. Do not take your medicine more often than directed. Remember that you will need to pass more urine after taking this medicine. Do not take your medicine at a time of day that will cause you problems. Do not take at bedtime. Talk to your pediatrician regarding the use of this medicine in children. Special care may be needed. Overdosage: If you think you have taken too much of this medicine contact a poison control center or emergency room at once. NOTE: This medicine is only for you. Do not share this medicine with others. What if I miss a dose? If you miss a dose, take it as soon as you can. If it is almost time for your next dose, take only that dose. Do not take double or extra doses. What may interact with this medicine?  alcohol  certain antibiotics given by injection certain heart medicines like digoxin  diuretics  lithium  medicines for diabetes  medicines  for blood pressure  medicines for cholesterol like cholestyramine  medicines that relax muscles for surgery  NSAIDs, medicines for pain and inflammation, like ibuprofen or naproxen  OTC supplements like ginseng and ephedra  probenecid  steroid medicines like prednisone or cortisone This list may not describe all possible interactions. Give your health care provider a list of all the medicines, herbs, non-prescription drugs, or dietary supplements you use. Also tell  them if you smoke, drink alcohol, or use illegal drugs. Some items may interact with your medicine. What should I watch for while using this medicine? Visit your doctor or health care professional for regular checks on your progress. Check your blood pressure regularly. Ask your doctor or health care professional what your blood pressure should be, and when you should contact him or her. If you are a diabetic, check your blood sugar as directed. You may need to be on a special diet while taking this medicine. Check with your doctor. Also, ask how many glasses of fluid you need to drink a day. You must not get dehydrated. You may get drowsy or dizzy. Do not drive, use machinery, or do anything that needs mental alertness until you know how this drug affects you. Do not stand or sit up quickly, especially if you are an older patient. This reduces the risk of dizzy or fainting spells. Alcohol can make you more drowsy and dizzy. Avoid alcoholic drinks. What side effects may I notice from receiving this medicine? Side effects that you should report to your doctor or health care professional as soon as possible:  allergic reactions such as skin rash or itching, hives, swelling of the lips, mouth, tongue or throat  blood in urine or stool  dry mouth  hearing loss or ringing in the ears  irregular heartbeat  muscle pain, weakness or cramps  pain or difficulty passing urine  unusually weak or tired  vomiting or diarrhea Side effects that usually do not require medical attention (report to your doctor or health care professional if they continue or are bothersome):  dizzy or lightheaded  headache  increased thirst  passing large amounts of urine  sexual difficulties  stomach pain, upset or nausea This list may not describe all possible side effects. Call your doctor for medical advice about side effects. You may report side effects to FDA at 1-800-FDA-1088. Where should I keep my  medicine? Keep out of the reach of children. Store at room temperature between 15 and 30 degrees C (59 and 86 degrees F). Throw away any unused medicine after the expiration date. NOTE: This sheet is a summary. It may not cover all possible information. If you have questions about this medicine, talk to your doctor, pharmacist, or health care provider.  2020 Elsevier/Gold Standard (2008-07-17 11:35:45)  Metolazone tablets What is this medicine? METOLAZONE (me TOLE a zone) is a diuretic. It increases the amount of urine passed, which causes the body to lose salt and water. This medicine is used to treat high blood pressure. It is also reduces the swelling and water retention caused by heart or kidney disease. This medicine may be used for other purposes; ask your health care provider or pharmacist if you have questions. COMMON BRAND NAME(S): Mykrox, Zaroxolyn What should I tell my health care provider before I take this medicine? They need to know if you have any of these conditions:  diabetes  gout  immune system problems, like lupus  kidney disease  liver disease  pancreatitis  small  amount of urine or difficulty passing urine  an unusual or allergic reaction to metolazone, sulfa drugs, other medicines, foods, dyes, or preservatives  pregnant or trying to get pregnant  breast-feeding How should I use this medicine? Take this medicine by mouth with a glass of water. Follow the directions on the prescription label. Remember that you will need to pass urine frequently after taking this medicine. Do not take your doses at a time of day that will cause you problems. Do not take at bedtime. Take your medicine at regular intervals. Do not take your medicine more often than directed. Do not stop taking except on your doctor's advice. Talk to your pediatrician regarding the use of this medicine in children. Special care may be needed. Overdosage: If you think you have taken too much of  this medicine contact a poison control center or emergency room at once. NOTE: This medicine is only for you. Do not share this medicine with others. What if I miss a dose? If you miss a dose, take it as soon as you can. If it is almost time for your next dose, take only that dose. Do not take double or extra doses. What may interact with this medicine?  alcohol  antiinflammatory drugs for pain or swelling  barbiturates for sleep or seizure control  digoxin  dofetilide  lithium  medicines for blood sugar  medicines for high blood pressure  medicines that relax muscles for surgery  methenamine  other diuretics  some medicines for pain  steroid hormones like cortisone, hydrocortisone, and prednisone  warfarin This list may not describe all possible interactions. Give your health care provider a list of all the medicines, herbs, non-prescription drugs, or dietary supplements you use. Also tell them if you smoke, drink alcohol, or use illegal drugs. Some items may interact with your medicine. What should I watch for while using this medicine? Visit your doctor or health care professional for regular checks on your progress. Check your blood pressure as directed. Ask your doctor or health care professional what your blood pressure should be and when you should contact him or her. You may need to be on a special diet while taking this medicine. Ask your doctor. Check with your doctor or health care professional if you get an attack of severe diarrhea, nausea and vomiting, or if you sweat a lot. The loss of too much body fluid can make it dangerous for you to take this medicine. You may get drowsy or dizzy. Do not drive, use machinery, or do anything that needs mental alertness until you know how this medicine affects you. Do not stand or sit up quickly, especially if you are an older patient. This reduces the risk of dizzy or fainting spells. Alcohol may interfere with the effect of  this medicine. Avoid alcoholic drinks. This medicine may affect your blood sugar level. If you have diabetes, check with your doctor or health care professional before changing the dose of your diabetic medicine. This medicine can make you more sensitive to the sun. Keep out of the sun. If you cannot avoid being in the sun, wear protective clothing and use sunscreen. Do not use sun lamps or tanning beds/booths. What side effects may I notice from receiving this medicine? Side effects that you should report to your doctor or health care professional as soon as possible:  allergic reactions such as skin rash or itching, hives, swelling of the lips, mouth, tongue, or throat  fast or irregular heartbeat,  chest pain  feeling faint  fever, chills  gout pain  hot red lump on leg  muscle pain, cramps  nausea, vomiting  numbness or tingling in hands, feet  pain or difficulty when passing urine  redness, blistering, peeling or loosening of the skin, including inside the mouth  unusual bleeding or bruising  unusually weak or tired  yellowing of the eyes, skin Side effects that usually do not require medical attention (report to your doctor or health care professional if they continue or are bothersome):  abdominal pain  blurred vision  constipation or diarrhea  dry mouth  headache This list may not describe all possible side effects. Call your doctor for medical advice about side effects. You may report side effects to FDA at 1-800-FDA-1088. Where should I keep my medicine? Keep out of the reach of children. Store at room temperature between 15 and 30 degrees C (59 and 86 degrees F). Protect from light. Keep container tightly closed. Throw away any unused medicine after the expiration date. NOTE: This sheet is a summary. It may not cover all possible information. If you have questions about this medicine, talk to your doctor, pharmacist, or health care provider.  2020  Elsevier/Gold Standard (2008-05-19 14:11:48)   Heart Failure  Weigh yourself every morning when you first wake up and record on a calender or note pad, bring this to your office visits. Using a pill tender can help with taking your medications consistently.  Limit your fluid intake to 2 liters daily  Limit your sodium intake to less than 2-3 grams daily. Ask if you need dietary teaching.  If you gain more than 3 pounds (from your dry weight ), double your dose of diuretic for the day.  If you gain more than 5 pounds (from your dry weight), double your dose of lasix and call your heart failure doctor.  Please do not smoke tobacco since it is very bad for your heart.  Please do not drink alcohol since it can worsen your heart failure.Also avoid OTC nonsteroidal drugs, such as advil, aleve and motrin.  Try to exercise for at least 30 minutes every day because this will help your heart be more efficient. You may be eligible for supervised cardiac rehab, ask your physician.

## 2019-11-14 LAB — BASIC METABOLIC PANEL
BUN/Creatinine Ratio: 23 (ref 10–24)
BUN: 51 mg/dL — ABNORMAL HIGH (ref 8–27)
CO2: 22 mmol/L (ref 20–29)
Calcium: 9.1 mg/dL (ref 8.6–10.2)
Chloride: 100 mmol/L (ref 96–106)
Creatinine, Ser: 2.24 mg/dL — ABNORMAL HIGH (ref 0.76–1.27)
GFR calc Af Amer: 32 mL/min/{1.73_m2} — ABNORMAL LOW (ref >59)
GFR calc non Af Amer: 27 mL/min/{1.73_m2} — ABNORMAL LOW (ref >59)
Glucose: 130 mg/dL — ABNORMAL HIGH (ref 65–99)
Potassium: 5.5 mmol/L — ABNORMAL HIGH (ref 3.5–5.2)
Sodium: 141 mmol/L (ref 134–144)

## 2019-11-14 LAB — TSH+T4F+T3FREE
Free T4: 2 ng/dL — ABNORMAL HIGH (ref 0.82–1.77)
T3, Free: 1.9 pg/mL — ABNORMAL LOW (ref 2.0–4.4)
TSH: 1.37 u[IU]/mL (ref 0.450–4.500)

## 2019-11-14 LAB — PRO B NATRIURETIC PEPTIDE: NT-Pro BNP: 2848 pg/mL — ABNORMAL HIGH (ref 0–486)

## 2019-11-19 NOTE — Progress Notes (Signed)
Cardiology Office Note:    Date:  11/20/2019   ID:  Antonio Snyder, DOB August 10, 1941, MRN [id number]  PCP:  Cher Nakai, MD  Cardiologist:  Shirlee More, MD    Referring MD: Cher Nakai, MD    ASSESSMENT:    1. Chronic diastolic heart failure ([place])   2. Hypertensive heart disease with heart failure (HCC)   3. Stage 3b chronic kidney disease   4. On amiodarone therapy    PLAN:    In order of problems listed above:  1. Improved but remains volume overloaded and decompensated continue his higher dose torsemide reduce metolazone to every other day recheck renal function proBNP stressed the importance of compliance with meds daily weights and sodium restriction and see back in the office in about 3 weeks. 2. BP at target continue current treatment including beta-blocker 3. Check renal function today 4. Continue low-dose amiodarone thyroid studies are normal   Next appointment: 3 weeks for his decompensated heart failure   Medication Adjustments/Labs and Tests Ordered: Current medicines are reviewed at length with the patient today.  Concerns regarding medicines are outlined above.  No orders of the defined types were placed in this encounter.  No orders of the defined types were placed in this encounter.   No chief complaint on file.   History of Present Illness:    Antonio Snyder is a 79 y.o. male with a hx of CAD, PAF on amiodarone, hypertensive heart disease with chronic diastolic heart failure, hyperlipidemia, CKD 3, COPD, DM 2 last seen 11/13/2019 with decompensated heart failure and anasarca.. Compliance with diet, lifestyle and medications: Yes  Reviewed the test below with the patient and his wife   Ref Range & Units 6 d ago 4 wk ago 1 yr ago  NT-Pro BNP 0 - 486 pg/mL 2,848High   2,743High  CM  1,364High     Ref Range & Units 6 d ago 4 wk ago 1 yr ago  Glucose 65 - 99 mg/dL 130High   73  185High    BUN 8 - 27 mg/dL 51High   50High   29High    Creatinine, Ser 0.76 -  1.27 mg/dL 2.24High   2.56High   1.43High    GFR calc non Af Amer >59 mL/min/1.73 27Low   23Low   48Low    GFR calc Af Amer >59 mL/min/1.73 32Low   27Low   55Low    BUN/Creatinine Ratio 10 - 24 23  20  20    Sodium 134 - 144 mmol/L 141  141  142   Potassium 3.5 - 5.2 mmol/L 5.5High   5.5High   4.8    Ref Range & Units 6 d ago 1 yr ago   TSH 0.450 - 4.500 uIU/mL 1.370  0.743   T3, Free 2.0 - 4.4 pg/mL 1.9Low     Free T4 0.82 - 1.77 ng/dL 2.00High      Urgently he responded to the addition of metolazone his weight is down 12 pounds still in the range of 20 pounds over his ideal weight and he is improved he is not short of breath but remains quite edematous and he and his wife are restricting sodium at home weighing daily and he is pleased with his improvement.  With his CKD will reduce his diet diuretic metolazone to every other day recheck renal function proBNP today and continue amiodarone is maintained sinus rhythm.  The realissue with him is compliance I have asked him for the time being  abstain from eating outside of his home.  His wife in house restricts his sodium Past Medical History:  Diagnosis Date  . AKI (acute kidney injury) ([place]) 11/18/2016  . Atrial flutter ([place]) 11/17/2016  . CAD in native artery 04/19/2016   03/2008 CABG LIMA to LAD, SVG DIAG, OM1, OM2  . Chronic diastolic heart failure ([place]) 04/19/2016  . COPD (chronic obstructive pulmonary disease) ([place]) 07/25/2017  . DM (diabetes mellitus) ([place]) 07/25/2017  . Dyslipidemia 01/29/2018  . Essential hypertension 04/19/2016  . 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 Hyperlipidemia 04/19/2016  . Hypertensive heart disease with heart failure ([place]) 04/19/2016  . Hypoxemia 11/21/2016  . Lactic acidosis 08/20/2016  . Left upper lobe pneumonia 08/20/2016  . On amiodarone therapy 04/19/2016  . PAF (paroxysmal atrial fibrillation) ([place]) 04/19/2016    Past Surgical History:  Procedure Laterality Date  . BACK SURGERY    . CORONARY ARTERY BYPASS GRAFT     . ELECTROPHYSIOLOGIC STUDY     atrial tach/flutter  . TOTAL HIP REVISION      Current Medications: Current Meds  Medication Sig  . amiodarone (PACERONE) 200 MG tablet TAKE 1 TABLET (200 MG TOTAL) BY MOUTH DAILY.  Marland Kitchen Ascorbic Acid (VITAMIN C) 1000 MG tablet Take 1,000 mg by mouth daily.  Marland Kitchen aspirin EC 81 MG tablet Take 81 mg by mouth daily.  Marland Kitchen atorvastatin (LIPITOR) 40 MG tablet Take 1 tablet (40 mg total) by mouth daily.  . metFORMIN (GLUCOPHAGE) 850 MG tablet Take 850 mg by mouth 2 (two) times daily with a meal.   . metolazone (ZAROXOLYN) 5 MG tablet Take 1 tablet (5 mg total) by mouth daily. Take in morning 30 minutes prior to taking torsemide  . metoprolol tartrate (LOPRESSOR) 100 MG tablet Take 0.5 tablets (50 mg total) by mouth daily.  . nabumetone (RELAFEN) 750 MG tablet daily.  . pantoprazole (PROTONIX) 40 MG tablet Take 40 mg by mouth 2 (two) times daily.   Marland Kitchen torsemide (DEMADEX) 20 MG tablet TAKE 2 tablets 40mg  twice daily  . VITAMIN K PO Take 5,000 Units by mouth daily at 2 PM.     Allergies:   Patient has no known allergies.   Social History   Socioeconomic History  . Marital status: Married    Spouse name: Not on file  . Number of children: Not on file  . Years of education: Not on file  . Highest education level: Not on file  Occupational History  . Not on file  Tobacco Use  . Smoking status: Never Smoker  . Smokeless tobacco: Never Used  Substance and Sexual Activity  . Alcohol use: Never  . Drug use: Never  . Sexual activity: Not on file  Other Topics Concern  . Not on file  Social History Narrative  . Not on file   Social Determinants of Health   Financial Resource Strain:   . Difficulty of Paying Living Expenses: Not on file  Food Insecurity:   . Worried About Charity fundraiser in the Last Year: Not on file  . Ran Out of Food in the Last Year: Not on file  Transportation Needs:   . Lack of Transportation (Medical): Not on file  . Lack of  Transportation (Non-Medical): Not on file  Physical Activity:   . Days of Exercise per Week: Not on file  . Minutes of Exercise per Session: Not on file  Stress:   . Feeling of Stress : Not on file  Social Connections:   . Frequency of Communication with Friends and Family: Not on file  . Frequency of Social Gatherings with Friends and Family: Not on file  . Attends Religious Services: Not on file  . Active Member of Clubs or Organizations: Not on file  . Attends Archivist Meetings: Not on file  . Marital Status: Not on file     Family History: The patient's family history includes Congestive Heart Failure in his mother; Heart attack in his father. ROS:   Please see the history of present illness.    All other systems reviewed and are negative.  EKGs/Labs/Other Studies Reviewed:    The following studies were reviewed today:  Recent Labs: 11/13/2019: BUN 51; Creatinine, Ser 2.24; NT-Pro BNP 2,848; Potassium 5.5; Sodium 141; TSH 1.370  Recent Lipid Panel No results found for: CHOL, TRIG, HDL, CHOLHDL, VLDL, LDLCALC, LDLDIRECT  Physical Exam:    VS:  BP 128/60   Pulse 63   Ht 5\' 4"  (1.626 m)   Wt 209 lb (94.8 kg)   SpO2 92%   BMI 35.87 kg/m     Wt Readings from Last 3 Encounters:  11/20/19 209 lb (94.8 kg)  11/13/19 221 lb 3.2 oz (100.3 kg)  05/20/19 200 lb (90.7 kg)     GEN: He appears comfortable he is not breathless well nourished, well developed in no acute distress HEENT: Normal NECK:; No carotid bruits LYMPHATICS: No lymphadenopathy CARDIAC: RRR, no murmurs, rubs, gallops RESPIRATORY:  Clear to auscultation without rales, wheezing or rhonchi  ABDOMEN: Soft, non-tender, non-distended MUSCULOSKELETAL: He still has 4+ edema to the thighs but does not have anasarca and only has moderate neck vein distention edema; No deformity  SKIN: Warm and dry NEUROLOGIC:  Alert and oriented x 3 PSYCHIATRIC:  Normal affect    Signed, Shirlee More, MD  11/20/2019  10:19 AM    [place]

## 2019-11-20 ENCOUNTER — Other Ambulatory Visit: Payer: Self-pay

## 2019-11-20 ENCOUNTER — Ambulatory Visit (INDEPENDENT_AMBULATORY_CARE_PROVIDER_SITE_OTHER): Payer: Medicare HMO | Admitting: Cardiology

## 2019-11-20 ENCOUNTER — Encounter: Payer: Self-pay | Admitting: Cardiology

## 2019-11-20 ENCOUNTER — Other Ambulatory Visit: Payer: Self-pay | Admitting: Cardiology

## 2019-11-20 VITALS — BP 128/60 | HR 63 | Ht 64.0 in | Wt 209.0 lb

## 2019-11-20 DIAGNOSIS — Z79899 Other long term (current) drug therapy: Secondary | ICD-10-CM

## 2019-11-20 DIAGNOSIS — I110 Hypertensive heart disease with heart failure: Secondary | ICD-10-CM | POA: Diagnosis not present

## 2019-11-20 DIAGNOSIS — N1832 Chronic kidney disease, stage 3b: Secondary | ICD-10-CM | POA: Diagnosis not present

## 2019-11-20 DIAGNOSIS — I5032 Chronic diastolic (congestive) heart failure: Secondary | ICD-10-CM | POA: Diagnosis not present

## 2019-11-20 MED ORDER — METOLAZONE 5 MG PO TABS: 5.0000 mg | ORAL_TABLET | ORAL | 3 refills | Status: DC

## 2019-11-20 NOTE — Patient Instructions (Signed)
Medication Instructions:  Your physician has recommended you make the following change in your medication:  1.  REDUCE the Metolazone to every other day  *If you need a refill on your cardiac medications before your next appointment, please call your pharmacy*  Lab Work: TODAY:  BMET & PRO BNP  If you have labs (blood work) drawn today and your tests are completely normal, you will receive your results only by: Marland Kitchen MyChart Message (if you have MyChart) OR . A paper copy in the mail If you have any lab test that is abnormal or we need to change your treatment, we will call you to review the results.  Testing/Procedures: None ordered  Follow-Up: At [facility], you and your health needs are our priority.  As part of our continuing mission to provide you with exceptional heart care, we have created designated Provider Care Teams.  These Care Teams include your primary Cardiologist (physician) and Advanced Practice Providers (APPs -  Physician Assistants and Nurse Practitioners) who all work together to provide you with the care you need, when you need it.  Your next appointment:   3 week(s)  The format for your next appointment:   In Person  Provider:   Shirlee More, MD  Other Instructions

## 2019-11-21 LAB — BASIC METABOLIC PANEL
BUN/Creatinine Ratio: 21 (ref 10–24)
BUN: 55 mg/dL — ABNORMAL HIGH (ref 8–27)
CO2: 28 mmol/L (ref 20–29)
Calcium: 8.9 mg/dL (ref 8.6–10.2)
Chloride: 90 mmol/L — ABNORMAL LOW (ref 96–106)
Creatinine, Ser: 2.68 mg/dL — ABNORMAL HIGH (ref 0.76–1.27)
GFR calc Af Amer: 26 mL/min/{1.73_m2} — ABNORMAL LOW (ref >59)
GFR calc non Af Amer: 22 mL/min/{1.73_m2} — ABNORMAL LOW (ref >59)
Glucose: 108 mg/dL — ABNORMAL HIGH (ref 65–99)
Potassium: 3.7 mmol/L (ref 3.5–5.2)
Sodium: 139 mmol/L (ref 134–144)

## 2019-11-21 LAB — PRO B NATRIURETIC PEPTIDE: NT-Pro BNP: 3352 pg/mL — ABNORMAL HIGH (ref 0–486)

## 2019-11-26 ENCOUNTER — Telehealth: Payer: Self-pay | Admitting: Emergency Medicine

## 2019-11-26 DIAGNOSIS — N183 Chronic kidney disease, stage 3 unspecified: Secondary | ICD-10-CM

## 2019-11-26 NOTE — Telephone Encounter (Signed)
Called patient, informed him of lab results per Dr. Bettina Gavia and advised the patient to come back this week for a repeat bmp.   During call patient reports that he is not losing weight since the medication change. He was the same weight yesterday and today. He denies shortness of breath. I advised him this may be expected as his diuretics were decreased however I will consult with Dr. Bettina Gavia and confirm what his plan is at this time.

## 2019-11-26 NOTE — Telephone Encounter (Signed)
I would like to see his follow-up renal function before I intensified his diuretics again

## 2019-11-27 NOTE — Telephone Encounter (Signed)
Spoke to patient's wife. Informed her Dr. Bettina Gavia needs repeat labs before increasing medication again. She verbally understand she will bring patient today for lab work.

## 2019-11-28 DIAGNOSIS — N183 Chronic kidney disease, stage 3 unspecified: Secondary | ICD-10-CM | POA: Diagnosis not present

## 2019-11-28 LAB — BASIC METABOLIC PANEL
BUN/Creatinine Ratio: 27 — ABNORMAL HIGH (ref 10–24)
BUN: 104 mg/dL (ref 8–27)
CO2: 25 mmol/L (ref 20–29)
Calcium: 8.8 mg/dL (ref 8.6–10.2)
Chloride: 88 mmol/L — ABNORMAL LOW (ref 96–106)
Creatinine, Ser: 3.81 mg/dL — ABNORMAL HIGH (ref 0.76–1.27)
GFR calc Af Amer: 17 mL/min/{1.73_m2} — ABNORMAL LOW (ref >59)
GFR calc non Af Amer: 14 mL/min/{1.73_m2} — ABNORMAL LOW (ref >59)
Glucose: 141 mg/dL — ABNORMAL HIGH (ref 65–99)
Potassium: 3.7 mmol/L (ref 3.5–5.2)
Sodium: 138 mmol/L (ref 134–144)

## 2019-11-30 DIAGNOSIS — R600 Localized edema: Secondary | ICD-10-CM | POA: Diagnosis not present

## 2019-11-30 DIAGNOSIS — M25551 Pain in right hip: Secondary | ICD-10-CM | POA: Diagnosis not present

## 2019-11-30 DIAGNOSIS — I4892 Unspecified atrial flutter: Secondary | ICD-10-CM | POA: Diagnosis not present

## 2019-11-30 DIAGNOSIS — E118 Type 2 diabetes mellitus with unspecified complications: Secondary | ICD-10-CM | POA: Diagnosis not present

## 2019-11-30 DIAGNOSIS — K219 Gastro-esophageal reflux disease without esophagitis: Secondary | ICD-10-CM | POA: Diagnosis not present

## 2019-11-30 DIAGNOSIS — N1832 Chronic kidney disease, stage 3b: Secondary | ICD-10-CM | POA: Diagnosis not present

## 2019-11-30 DIAGNOSIS — R9431 Abnormal electrocardiogram [ECG] [EKG]: Secondary | ICD-10-CM | POA: Diagnosis not present

## 2019-11-30 DIAGNOSIS — M159 Polyosteoarthritis, unspecified: Secondary | ICD-10-CM | POA: Diagnosis not present

## 2019-11-30 DIAGNOSIS — N189 Chronic kidney disease, unspecified: Secondary | ICD-10-CM | POA: Diagnosis not present

## 2019-12-03 ENCOUNTER — Telehealth: Payer: Self-pay

## 2019-12-03 DIAGNOSIS — N1832 Chronic kidney disease, stage 3b: Secondary | ICD-10-CM

## 2019-12-03 NOTE — Telephone Encounter (Signed)
Results relayed, patient will come in for repeat labs this week. He will stop taking metolazone 5 mg and torsemide 80 mg as directed by Dr. Bettina Gavia. Patient scheduled for f/u visit on 12/11/19. No further questions

## 2019-12-03 NOTE — Telephone Encounter (Signed)
-----   Message from Richardo Priest, MD sent at 11/28/2019  4:42 PM EST ----- He has had a dramatic deterioration in kidney function stop all diuretics  Recheck Monday working to my office hours in the morning and will draw during that visit in [place]

## 2019-12-04 LAB — BASIC METABOLIC PANEL
BUN/Creatinine Ratio: 29 — ABNORMAL HIGH (ref 10–24)
BUN: 104 mg/dL (ref 8–27)
CO2: 24 mmol/L (ref 20–29)
Calcium: 9 mg/dL (ref 8.6–10.2)
Chloride: 88 mmol/L — ABNORMAL LOW (ref 96–106)
Creatinine, Ser: 3.63 mg/dL — ABNORMAL HIGH (ref 0.76–1.27)
GFR calc Af Amer: 18 mL/min/{1.73_m2} — ABNORMAL LOW (ref >59)
GFR calc non Af Amer: 15 mL/min/{1.73_m2} — ABNORMAL LOW (ref >59)
Glucose: 152 mg/dL — ABNORMAL HIGH (ref 65–99)
Potassium: 4.1 mmol/L (ref 3.5–5.2)
Sodium: 136 mmol/L (ref 134–144)

## 2019-12-04 LAB — PHOSPHORUS: Phosphorus: 4.6 mg/dL — ABNORMAL HIGH (ref 2.8–4.1)

## 2019-12-04 LAB — MAGNESIUM: Magnesium: 1.7 mg/dL (ref 1.6–2.3)

## 2019-12-07 DIAGNOSIS — Z139 Encounter for screening, unspecified: Secondary | ICD-10-CM | POA: Diagnosis not present

## 2019-12-07 DIAGNOSIS — Z9181 History of falling: Secondary | ICD-10-CM | POA: Diagnosis not present

## 2019-12-07 DIAGNOSIS — N189 Chronic kidney disease, unspecified: Secondary | ICD-10-CM | POA: Diagnosis not present

## 2019-12-07 DIAGNOSIS — R600 Localized edema: Secondary | ICD-10-CM | POA: Diagnosis not present

## 2019-12-07 DIAGNOSIS — R9431 Abnormal electrocardiogram [ECG] [EKG]: Secondary | ICD-10-CM | POA: Diagnosis not present

## 2019-12-07 DIAGNOSIS — N1832 Chronic kidney disease, stage 3b: Secondary | ICD-10-CM | POA: Diagnosis not present

## 2019-12-07 DIAGNOSIS — Z1331 Encounter for screening for depression: Secondary | ICD-10-CM | POA: Diagnosis not present

## 2019-12-07 DIAGNOSIS — M159 Polyosteoarthritis, unspecified: Secondary | ICD-10-CM | POA: Diagnosis not present

## 2019-12-07 DIAGNOSIS — E118 Type 2 diabetes mellitus with unspecified complications: Secondary | ICD-10-CM | POA: Diagnosis not present

## 2019-12-10 DIAGNOSIS — Z1211 Encounter for screening for malignant neoplasm of colon: Secondary | ICD-10-CM | POA: Diagnosis not present

## 2019-12-10 DIAGNOSIS — E669 Obesity, unspecified: Secondary | ICD-10-CM | POA: Diagnosis not present

## 2019-12-10 DIAGNOSIS — Z9181 History of falling: Secondary | ICD-10-CM | POA: Diagnosis not present

## 2019-12-10 DIAGNOSIS — Z125 Encounter for screening for malignant neoplasm of prostate: Secondary | ICD-10-CM | POA: Diagnosis not present

## 2019-12-10 DIAGNOSIS — Z0000 Encounter for general adult medical examination without abnormal findings: Secondary | ICD-10-CM | POA: Diagnosis not present

## 2019-12-10 DIAGNOSIS — E785 Hyperlipidemia, unspecified: Secondary | ICD-10-CM | POA: Diagnosis not present

## 2019-12-10 DIAGNOSIS — Z1331 Encounter for screening for depression: Secondary | ICD-10-CM | POA: Diagnosis not present

## 2019-12-10 DIAGNOSIS — Z139 Encounter for screening, unspecified: Secondary | ICD-10-CM | POA: Diagnosis not present

## 2019-12-10 NOTE — Progress Notes (Signed)
Cardiology Office Note:    Date:  12/11/2019   ID:  Antonio Snyder, DOB 1941/08/29, MRN [id number]  PCP:  Cher Nakai, MD  Cardiologist:  Shirlee More, MD    Referring MD: Cher Nakai, MD    ASSESSMENT:    1. Hypertensive heart disease with heart failure (HCC)   2. Stage 4 chronic kidney disease ([place])   3. PAF (paroxysmal atrial fibrillation) ([place])   4. On amiodarone therapy    PLAN:    In order of problems listed above:  1. Worsened heart failure refractory worsening renal function despite instructions he continue taking torsemide we will recheck his renal function of worsen may need admission to the hospital we will try to get him set up with nephrology as quickly as possible.  In the interim I asked him to stop Metformin and nonsteroidal anti-inflammatory drugs that can contribute to nephrotoxicity as he is continue taking torsemide we will just continue the same for now.  May require renal replacement therapy to manage his heart failure 2. He is maintaining sinus rhythm continue amiodarone and current anticoagulant and requires no dose adjustment for renal failure Discontinue Metformin with diabetes 3. Discontinue nonsteroidal anti-inflammatory drug   Next appointment: 3 weeks   Medication Adjustments/Labs and Tests Ordered: Current medicines are reviewed at length with the patient today.  Concerns regarding medicines are outlined above.  No orders of the defined types were placed in this encounter.  No orders of the defined types were placed in this encounter.   Chief Complaint  Patient presents with  . Abnormal Lab    History of Present Illness:    Antonio Snyder is a 79 y.o. male with a hx of  CAD, PAF on amiodarone, hypertensive heart disease with chronic diastolic heart failure, hyperlipidemia, CKD 3, COPD, DM 2 with decompensated heart failure and anasarca..  He was last seen 11/20/2019. Compliance with diet, lifestyle and medications: No  He is taking several  medicines he should not including Metformin nonsteroidal anti-inflammatory drug and he continue taking torsemide despite instructions last week.  He short of breath with activities but is really limited by back and joint pain is not having orthopnea he has diffuse edema and anasarca but no angina palpitation or syncope.  We will try to set up a nephrology consultation as quickly as possible I asked for last week and it did not occur recheck his labs of his kidney function is worse he may need admission to the hospital.  Also order an echocardiogram to see if he has had a change in his previous normal ejection fraction.  In summary his heart failure is refractory and his kidney function is worsened.   Ref Range & Units 7 d ago 12 d ago 2 wk ago 3 wk ago  Glucose 65 - 99 mg/dL 152High   141High   108High   130High    BUN 8 - 27 mg/dL 104High Panic   104High Panic   55High   51High    Creatinine, Ser 0.76 - 1.27 mg/dL 3.63High   3.81High   2.68High   2.24High    GFR calc non Af Amer >59 mL/min/1.73 15Low   14Low   22Low   27Low    GFR calc Af Amer >59 mL/min/1.73 18Low   17Low   26Low   32Low    BUN/Creatinine Ratio 10 - 24 29High   27High   21  23   Sodium 134 - 144 mmol/L 136  138  139  141   Potassium 3.5 - 5.2 mmol/L 4.1  3.7  3.7  5.5High     Past Medical History:  Diagnosis Date  . AKI (acute kidney injury) ([place]) 11/18/2016  . Atrial flutter ([place]) 11/17/2016  . CAD in native artery 04/19/2016   03/2008 CABG LIMA to LAD, SVG DIAG, OM1, OM2  . Chronic diastolic heart failure ([place]) 04/19/2016  . COPD (chronic obstructive pulmonary disease) ([place]) 07/25/2017  . DM (diabetes mellitus) ([place]) 07/25/2017  . Dyslipidemia 01/29/2018  . Essential hypertension 04/19/2016  . 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 Hyperlipidemia 04/19/2016  . Hypertensive heart disease with heart failure ([place]) 04/19/2016  . Hypoxemia 11/21/2016  . Lactic acidosis 08/20/2016  . Left upper lobe pneumonia 08/20/2016  . On amiodarone  therapy 04/19/2016  . PAF (paroxysmal atrial fibrillation) ([place]) 04/19/2016    Past Surgical History:  Procedure Laterality Date  . BACK SURGERY    . CORONARY ARTERY BYPASS GRAFT    . ELECTROPHYSIOLOGIC STUDY     atrial tach/flutter  . TOTAL HIP REVISION      Current Medications: No outpatient medications have been marked as taking for the 12/11/19 encounter (Office Visit) with Richardo Priest, MD.     Allergies:   Patient has no known allergies.   Social History   Socioeconomic History  . Marital status: Married    Spouse name: Not on file  . Number of children: Not on file  . Years of education: Not on file  . Highest education level: Not on file  Occupational History  . Not on file  Tobacco Use  . Smoking status: Never Smoker  . Smokeless tobacco: Never Used  Substance and Sexual Activity  . Alcohol use: Never  . Drug use: Never  . Sexual activity: Not on file  Other Topics Concern  . Not on file  Social History Narrative  . Not on file   Social Determinants of Health   Financial Resource Strain:   . Difficulty of Paying Living Expenses: Not on file  Food Insecurity:   . Worried About Charity fundraiser in the Last Year: Not on file  . Ran Out of Food in the Last Year: Not on file  Transportation Needs:   . Lack of Transportation (Medical): Not on file  . Lack of Transportation (Non-Medical): Not on file  Physical Activity:   . Days of Exercise per Week: Not on file  . Minutes of Exercise per Session: Not on file  Stress:   . Feeling of Stress : Not on file  Social Connections:   . Frequency of Communication with Friends and Family: Not on file  . Frequency of Social Gatherings with Friends and Family: Not on file  . Attends Religious Services: Not on file  . Active Member of Clubs or Organizations: Not on file  . Attends Archivist Meetings: Not on file  . Marital Status: Not on file     Family History: The patient's family history includes  Congestive Heart Failure in his mother; Heart attack in his father. ROS:   Please see the history of present illness.    All other systems reviewed and are negative.  EKGs/Labs/Other Studies Reviewed:    The following studies were reviewed today:  EKG:  EKG  demonstrates sinus rhythm first-degree AV block EKG independently reviewed 11/13/2019  Recent Labs: 11/13/2019: TSH 1.370 11/20/2019: NT-Pro BNP 3,352 12/03/2019: BUN 104; Creatinine, Ser 3.63; Magnesium 1.7; Potassium  4.1; Sodium 136  Recent Lipid Panel No results found for: CHOL, TRIG, HDL, CHOLHDL, VLDL, LDLCALC, LDLDIRECT  Physical Exam:    VS:  BP 112/68 (BP Location: Left Arm, Patient Position: Sitting, Cuff Size: Normal)   Pulse 67   Ht 5\' 4"  (1.626 m)   Wt 218 lb (98.9 kg)   SpO2 91%   BMI 37.42 kg/m     Wt Readings from Last 3 Encounters:  12/11/19 218 lb (98.9 kg)  11/20/19 209 lb (94.8 kg)  11/13/19 221 lb 3.2 oz (100.3 kg)     GEN: He appears more chronically ill and debilitated than when last seen well nourished, well developed in no acute distress and has pallor of the skin of membranes HEENT: Normal NECK: No JVD; No carotid bruits LYMPHATICS: No lymphadenopathy CARDIAC: RRR, no murmurs, rubs, gallops RESPIRATORY:  Clear to auscultation without rales, wheezing or rhonchi  ABDOMEN: Soft, non-tender, non-distended MUSCULOSKELETAL: He has 4+ lower extremity edema to the knees and to his umbilicus he has anasarca edema; No deformity  SKIN: Warm and dry NEUROLOGIC:  Alert and oriented x 3 PSYCHIATRIC:  Normal affect    Signed, Shirlee More, MD  12/11/2019 10:23 AM    [place]

## 2019-12-11 ENCOUNTER — Other Ambulatory Visit: Payer: Self-pay

## 2019-12-11 ENCOUNTER — Encounter: Payer: Self-pay | Admitting: Cardiology

## 2019-12-11 ENCOUNTER — Telehealth: Payer: Self-pay | Admitting: Cardiology

## 2019-12-11 ENCOUNTER — Ambulatory Visit (INDEPENDENT_AMBULATORY_CARE_PROVIDER_SITE_OTHER): Payer: Medicare HMO | Admitting: Cardiology

## 2019-12-11 VITALS — BP 112/68 | HR 67 | Ht 64.0 in | Wt 218.0 lb

## 2019-12-11 DIAGNOSIS — I480 Paroxysmal atrial fibrillation: Secondary | ICD-10-CM | POA: Diagnosis not present

## 2019-12-11 DIAGNOSIS — Z79899 Other long term (current) drug therapy: Secondary | ICD-10-CM | POA: Diagnosis not present

## 2019-12-11 DIAGNOSIS — I110 Hypertensive heart disease with heart failure: Secondary | ICD-10-CM | POA: Diagnosis not present

## 2019-12-11 DIAGNOSIS — N184 Chronic kidney disease, stage 4 (severe): Secondary | ICD-10-CM

## 2019-12-11 LAB — CBC
Hematocrit: 38.6 % (ref 37.5–51.0)
Hemoglobin: 11.8 g/dL — ABNORMAL LOW (ref 13.0–17.7)
MCH: 26 pg — ABNORMAL LOW (ref 26.6–33.0)
MCHC: 30.6 g/dL — ABNORMAL LOW (ref 31.5–35.7)
MCV: 85 fL (ref 79–97)
Platelets: 267 10*3/uL (ref 150–450)
RBC: 4.54 x10E6/uL (ref 4.14–5.80)
RDW: 17.2 % — ABNORMAL HIGH (ref 11.6–15.4)
WBC: 10.2 10*3/uL (ref 3.4–10.8)

## 2019-12-11 LAB — BASIC METABOLIC PANEL
BUN/Creatinine Ratio: 33 — ABNORMAL HIGH (ref 10–24)
BUN: 105 mg/dL (ref 8–27)
CO2: 26 mmol/L (ref 20–29)
Calcium: 9.1 mg/dL (ref 8.6–10.2)
Chloride: 92 mmol/L — ABNORMAL LOW (ref 96–106)
Creatinine, Ser: 3.15 mg/dL — ABNORMAL HIGH (ref 0.76–1.27)
GFR calc Af Amer: 21 mL/min/{1.73_m2} — ABNORMAL LOW (ref >59)
GFR calc non Af Amer: 18 mL/min/{1.73_m2} — ABNORMAL LOW (ref >59)
Glucose: 169 mg/dL — ABNORMAL HIGH (ref 65–99)
Potassium: 4.7 mmol/L (ref 3.5–5.2)
Sodium: 138 mmol/L (ref 134–144)

## 2019-12-11 LAB — PRO B NATRIURETIC PEPTIDE: NT-Pro BNP: 6279 pg/mL — ABNORMAL HIGH (ref 0–486)

## 2019-12-11 NOTE — Telephone Encounter (Signed)
Wife of patient called. She wanted to confirm some of the medication changes that Dr. Bettina Gavia made at the patient's appointment today. She also wanted to make sure the patient is still supposed to be asking his Torsemide ( the fluid pill).  The way the patient understood what was discussed he was told to stop the medicine.   Please call the wife and let her know what the patient needs to do

## 2019-12-11 NOTE — Patient Instructions (Signed)
Medication Instructions:  1) Discontinue Metformin   2) Discontinue Relafen   *If you need a refill on your cardiac medications before your next appointment, please call your pharmacy*  Lab Work: *STAT* BMP, Pro Bnp & Cbc  If you have labs (blood work) drawn today and your tests are completely normal, you will receive your results only by: Marland Kitchen MyChart Message (if you have MyChart) OR . A paper copy in the mail If you have any lab test that is abnormal or we need to change your treatment, we will call you to review the results.  Testing/Procedures: Your physician has requested that you have an echocardiogram. Echocardiography is a painless test that uses sound waves to create images of your heart. It provides your doctor with information about the size and shape of your heart and how well your heart's chambers and valves are working. This procedure takes approximately one hour. There are no restrictions for this procedure.    Follow-Up: At [facility], you and your health needs are our priority.  As part of our continuing mission to provide you with exceptional heart care, we have created designated Provider Care Teams.  These Care Teams include your primary Cardiologist (physician) and Advanced Practice Providers (APPs -  Physician Assistants and Nurse Practitioners) who all work together to provide you with the care you need, when you need it.  Your next appointment:   3 week(s)  The format for your next appointment:   In Person  Provider:   Shirlee More, MD  Other Instructions None

## 2019-12-11 NOTE — Telephone Encounter (Signed)
Spoke with patients wife. All questions have been addressed.

## 2019-12-11 NOTE — Telephone Encounter (Signed)
Patient seen in office and labs discussed.

## 2019-12-12 ENCOUNTER — Ambulatory Visit: Payer: Medicare HMO | Admitting: Cardiology

## 2019-12-14 DIAGNOSIS — K219 Gastro-esophageal reflux disease without esophagitis: Secondary | ICD-10-CM | POA: Diagnosis not present

## 2019-12-14 DIAGNOSIS — M25511 Pain in right shoulder: Secondary | ICD-10-CM | POA: Diagnosis not present

## 2019-12-14 DIAGNOSIS — R600 Localized edema: Secondary | ICD-10-CM | POA: Diagnosis not present

## 2019-12-14 DIAGNOSIS — N1832 Chronic kidney disease, stage 3b: Secondary | ICD-10-CM | POA: Diagnosis not present

## 2019-12-14 DIAGNOSIS — R9431 Abnormal electrocardiogram [ECG] [EKG]: Secondary | ICD-10-CM | POA: Diagnosis not present

## 2019-12-14 DIAGNOSIS — I4892 Unspecified atrial flutter: Secondary | ICD-10-CM | POA: Diagnosis not present

## 2019-12-14 DIAGNOSIS — E118 Type 2 diabetes mellitus with unspecified complications: Secondary | ICD-10-CM | POA: Diagnosis not present

## 2019-12-14 DIAGNOSIS — M159 Polyosteoarthritis, unspecified: Secondary | ICD-10-CM | POA: Diagnosis not present

## 2019-12-14 DIAGNOSIS — N189 Chronic kidney disease, unspecified: Secondary | ICD-10-CM | POA: Diagnosis not present

## 2019-12-20 DIAGNOSIS — M25551 Pain in right hip: Secondary | ICD-10-CM | POA: Diagnosis not present

## 2019-12-20 DIAGNOSIS — M159 Polyosteoarthritis, unspecified: Secondary | ICD-10-CM | POA: Diagnosis not present

## 2019-12-20 DIAGNOSIS — N1832 Chronic kidney disease, stage 3b: Secondary | ICD-10-CM | POA: Diagnosis not present

## 2019-12-20 DIAGNOSIS — M25552 Pain in left hip: Secondary | ICD-10-CM | POA: Diagnosis not present

## 2019-12-20 DIAGNOSIS — R9431 Abnormal electrocardiogram [ECG] [EKG]: Secondary | ICD-10-CM | POA: Diagnosis not present

## 2019-12-20 DIAGNOSIS — E118 Type 2 diabetes mellitus with unspecified complications: Secondary | ICD-10-CM | POA: Diagnosis not present

## 2019-12-20 DIAGNOSIS — N189 Chronic kidney disease, unspecified: Secondary | ICD-10-CM | POA: Diagnosis not present

## 2019-12-20 DIAGNOSIS — R600 Localized edema: Secondary | ICD-10-CM | POA: Diagnosis not present

## 2019-12-20 DIAGNOSIS — I4892 Unspecified atrial flutter: Secondary | ICD-10-CM | POA: Diagnosis not present

## 2019-12-31 DIAGNOSIS — M159 Polyosteoarthritis, unspecified: Secondary | ICD-10-CM | POA: Diagnosis not present

## 2019-12-31 DIAGNOSIS — E118 Type 2 diabetes mellitus with unspecified complications: Secondary | ICD-10-CM | POA: Diagnosis not present

## 2019-12-31 DIAGNOSIS — N1832 Chronic kidney disease, stage 3b: Secondary | ICD-10-CM | POA: Diagnosis not present

## 2019-12-31 DIAGNOSIS — K219 Gastro-esophageal reflux disease without esophagitis: Secondary | ICD-10-CM | POA: Diagnosis not present

## 2019-12-31 DIAGNOSIS — R9431 Abnormal electrocardiogram [ECG] [EKG]: Secondary | ICD-10-CM | POA: Diagnosis not present

## 2019-12-31 DIAGNOSIS — I4892 Unspecified atrial flutter: Secondary | ICD-10-CM | POA: Diagnosis not present

## 2019-12-31 DIAGNOSIS — N189 Chronic kidney disease, unspecified: Secondary | ICD-10-CM | POA: Diagnosis not present

## 2019-12-31 DIAGNOSIS — R600 Localized edema: Secondary | ICD-10-CM | POA: Diagnosis not present

## 2019-12-31 DIAGNOSIS — D509 Iron deficiency anemia, unspecified: Secondary | ICD-10-CM | POA: Diagnosis not present

## 2020-01-01 ENCOUNTER — Other Ambulatory Visit: Payer: Self-pay | Admitting: Cardiology

## 2020-01-01 DIAGNOSIS — I480 Paroxysmal atrial fibrillation: Secondary | ICD-10-CM

## 2020-01-02 DIAGNOSIS — I509 Heart failure, unspecified: Secondary | ICD-10-CM | POA: Diagnosis not present

## 2020-01-02 DIAGNOSIS — N179 Acute kidney failure, unspecified: Secondary | ICD-10-CM

## 2020-01-02 DIAGNOSIS — S79921A Unspecified injury of right thigh, initial encounter: Secondary | ICD-10-CM | POA: Diagnosis not present

## 2020-01-02 DIAGNOSIS — R609 Edema, unspecified: Secondary | ICD-10-CM | POA: Diagnosis not present

## 2020-01-02 DIAGNOSIS — M25552 Pain in left hip: Secondary | ICD-10-CM | POA: Diagnosis not present

## 2020-01-02 DIAGNOSIS — N289 Disorder of kidney and ureter, unspecified: Secondary | ICD-10-CM | POA: Diagnosis not present

## 2020-01-02 DIAGNOSIS — R0902 Hypoxemia: Secondary | ICD-10-CM

## 2020-01-02 DIAGNOSIS — W19XXXA Unspecified fall, initial encounter: Secondary | ICD-10-CM | POA: Diagnosis not present

## 2020-01-02 DIAGNOSIS — S299XXA Unspecified injury of thorax, initial encounter: Secondary | ICD-10-CM | POA: Diagnosis not present

## 2020-01-02 DIAGNOSIS — S79911A Unspecified injury of right hip, initial encounter: Secondary | ICD-10-CM | POA: Diagnosis not present

## 2020-01-02 DIAGNOSIS — S3993XA Unspecified injury of pelvis, initial encounter: Secondary | ICD-10-CM | POA: Diagnosis not present

## 2020-01-02 DIAGNOSIS — S199XXA Unspecified injury of neck, initial encounter: Secondary | ICD-10-CM | POA: Diagnosis not present

## 2020-01-02 DIAGNOSIS — R601 Generalized edema: Secondary | ICD-10-CM

## 2020-01-02 DIAGNOSIS — S0990XA Unspecified injury of head, initial encounter: Secondary | ICD-10-CM | POA: Diagnosis not present

## 2020-01-02 DIAGNOSIS — S79912A Unspecified injury of left hip, initial encounter: Secondary | ICD-10-CM | POA: Diagnosis not present

## 2020-01-02 DIAGNOSIS — M25551 Pain in right hip: Secondary | ICD-10-CM

## 2020-01-02 DIAGNOSIS — R52 Pain, unspecified: Secondary | ICD-10-CM | POA: Diagnosis not present

## 2020-01-02 DIAGNOSIS — I10 Essential (primary) hypertension: Secondary | ICD-10-CM | POA: Diagnosis not present

## 2020-01-02 DIAGNOSIS — J90 Pleural effusion, not elsewhere classified: Secondary | ICD-10-CM | POA: Diagnosis not present

## 2020-01-02 DIAGNOSIS — R262 Difficulty in walking, not elsewhere classified: Secondary | ICD-10-CM

## 2020-01-02 DIAGNOSIS — I110 Hypertensive heart disease with heart failure: Secondary | ICD-10-CM | POA: Diagnosis not present

## 2020-01-03 ENCOUNTER — Telehealth: Payer: Self-pay | Admitting: Cardiology

## 2020-01-03 DIAGNOSIS — S3993XA Unspecified injury of pelvis, initial encounter: Secondary | ICD-10-CM | POA: Diagnosis not present

## 2020-01-03 DIAGNOSIS — M7989 Other specified soft tissue disorders: Secondary | ICD-10-CM | POA: Diagnosis not present

## 2020-01-03 DIAGNOSIS — J90 Pleural effusion, not elsewhere classified: Secondary | ICD-10-CM | POA: Diagnosis not present

## 2020-01-03 DIAGNOSIS — S32592A Other specified fracture of left pubis, initial encounter for closed fracture: Secondary | ICD-10-CM | POA: Diagnosis not present

## 2020-01-03 DIAGNOSIS — M8448XA Pathological fracture, other site, initial encounter for fracture: Secondary | ICD-10-CM | POA: Diagnosis not present

## 2020-01-03 DIAGNOSIS — I482 Chronic atrial fibrillation, unspecified: Secondary | ICD-10-CM | POA: Diagnosis not present

## 2020-01-03 DIAGNOSIS — S299XXA Unspecified injury of thorax, initial encounter: Secondary | ICD-10-CM | POA: Diagnosis not present

## 2020-01-03 DIAGNOSIS — I2510 Atherosclerotic heart disease of native coronary artery without angina pectoris: Secondary | ICD-10-CM | POA: Diagnosis not present

## 2020-01-03 DIAGNOSIS — Z96643 Presence of artificial hip joint, bilateral: Secondary | ICD-10-CM | POA: Diagnosis not present

## 2020-01-03 DIAGNOSIS — R262 Difficulty in walking, not elsewhere classified: Secondary | ICD-10-CM | POA: Diagnosis not present

## 2020-01-03 DIAGNOSIS — S0990XA Unspecified injury of head, initial encounter: Secondary | ICD-10-CM | POA: Diagnosis not present

## 2020-01-03 DIAGNOSIS — I110 Hypertensive heart disease with heart failure: Secondary | ICD-10-CM | POA: Diagnosis not present

## 2020-01-03 DIAGNOSIS — I509 Heart failure, unspecified: Secondary | ICD-10-CM | POA: Diagnosis not present

## 2020-01-03 DIAGNOSIS — R188 Other ascites: Secondary | ICD-10-CM | POA: Diagnosis not present

## 2020-01-03 DIAGNOSIS — R918 Other nonspecific abnormal finding of lung field: Secondary | ICD-10-CM | POA: Diagnosis not present

## 2020-01-03 DIAGNOSIS — M25551 Pain in right hip: Secondary | ICD-10-CM | POA: Diagnosis not present

## 2020-01-03 DIAGNOSIS — S199XXA Unspecified injury of neck, initial encounter: Secondary | ICD-10-CM | POA: Diagnosis not present

## 2020-01-03 DIAGNOSIS — I130 Hypertensive heart and chronic kidney disease with heart failure and stage 1 through stage 4 chronic kidney disease, or unspecified chronic kidney disease: Secondary | ICD-10-CM | POA: Diagnosis not present

## 2020-01-03 DIAGNOSIS — R0902 Hypoxemia: Secondary | ICD-10-CM | POA: Diagnosis not present

## 2020-01-03 DIAGNOSIS — M25552 Pain in left hip: Secondary | ICD-10-CM | POA: Diagnosis not present

## 2020-01-03 DIAGNOSIS — I429 Cardiomyopathy, unspecified: Secondary | ICD-10-CM | POA: Diagnosis not present

## 2020-01-03 DIAGNOSIS — S79921A Unspecified injury of right thigh, initial encounter: Secondary | ICD-10-CM | POA: Diagnosis not present

## 2020-01-03 DIAGNOSIS — N179 Acute kidney failure, unspecified: Secondary | ICD-10-CM | POA: Diagnosis not present

## 2020-01-03 DIAGNOSIS — S32591A Other specified fracture of right pubis, initial encounter for closed fracture: Secondary | ICD-10-CM | POA: Diagnosis not present

## 2020-01-03 DIAGNOSIS — S79912A Unspecified injury of left hip, initial encounter: Secondary | ICD-10-CM | POA: Diagnosis not present

## 2020-01-03 DIAGNOSIS — S79911A Unspecified injury of right hip, initial encounter: Secondary | ICD-10-CM | POA: Diagnosis not present

## 2020-01-03 DIAGNOSIS — I071 Rheumatic tricuspid insufficiency: Secondary | ICD-10-CM | POA: Diagnosis not present

## 2020-01-03 DIAGNOSIS — N289 Disorder of kidney and ureter, unspecified: Secondary | ICD-10-CM | POA: Diagnosis not present

## 2020-01-03 DIAGNOSIS — R601 Generalized edema: Secondary | ICD-10-CM | POA: Diagnosis not present

## 2020-01-03 DIAGNOSIS — I5043 Acute on chronic combined systolic (congestive) and diastolic (congestive) heart failure: Secondary | ICD-10-CM | POA: Diagnosis not present

## 2020-01-03 NOTE — Telephone Encounter (Signed)
Spoke with patient's spouse. Spouse called to notify Dr. Bettina Gavia that patient is in [place] hospital following a fall this week. Patient's legs are swollen and leaking. She wanted the MD to know and see if he would go by to evaluate. Informed spouse hospital staff will treat and monitor the leg swelling but she wanted MD to be aware.

## 2020-01-03 NOTE — Telephone Encounter (Signed)
Per pt's wife call pt is hospitalized at [facility]. And his legs are leaking she would like that looked at please if possible ordered by Dr Abundio Miu

## 2020-01-04 DIAGNOSIS — N289 Disorder of kidney and ureter, unspecified: Secondary | ICD-10-CM

## 2020-01-06 ENCOUNTER — Ambulatory Visit: Payer: Medicare HMO | Admitting: Cardiology

## 2020-01-08 DIAGNOSIS — I2510 Atherosclerotic heart disease of native coronary artery without angina pectoris: Secondary | ICD-10-CM

## 2020-01-08 DIAGNOSIS — R601 Generalized edema: Secondary | ICD-10-CM

## 2020-01-08 DIAGNOSIS — N289 Disorder of kidney and ureter, unspecified: Secondary | ICD-10-CM

## 2020-01-08 DIAGNOSIS — I071 Rheumatic tricuspid insufficiency: Secondary | ICD-10-CM

## 2020-01-11 ENCOUNTER — Inpatient Hospital Stay (HOSPITAL_COMMUNITY): Payer: Medicare HMO

## 2020-01-11 ENCOUNTER — Other Ambulatory Visit: Payer: Self-pay

## 2020-01-11 ENCOUNTER — Inpatient Hospital Stay (HOSPITAL_COMMUNITY)
Admission: AD | Admit: 2020-01-11 | Discharge: 2020-01-12 | DRG: 291 | Disposition: A | Discharge status: Home Health | Payer: Medicare HMO | Source: Other Acute Inpatient Hospital | Attending: Internal Medicine | Admitting: Internal Medicine

## 2020-01-11 DIAGNOSIS — Z66 Do not resuscitate: Secondary | ICD-10-CM | POA: Diagnosis not present

## 2020-01-11 DIAGNOSIS — I2510 Atherosclerotic heart disease of native coronary artery without angina pectoris: Secondary | ICD-10-CM

## 2020-01-11 DIAGNOSIS — Z9119 Patient's noncompliance with other medical treatment and regimen: Secondary | ICD-10-CM | POA: Diagnosis not present

## 2020-01-11 DIAGNOSIS — I480 Paroxysmal atrial fibrillation: Secondary | ICD-10-CM | POA: Diagnosis present

## 2020-01-11 DIAGNOSIS — J449 Chronic obstructive pulmonary disease, unspecified: Secondary | ICD-10-CM | POA: Diagnosis present

## 2020-01-11 DIAGNOSIS — R52 Pain, unspecified: Secondary | ICD-10-CM | POA: Diagnosis not present

## 2020-01-11 DIAGNOSIS — I959 Hypotension, unspecified: Secondary | ICD-10-CM | POA: Diagnosis not present

## 2020-01-11 DIAGNOSIS — S32592B Other specified fracture of left pubis, initial encounter for open fracture: Secondary | ICD-10-CM | POA: Diagnosis not present

## 2020-01-11 DIAGNOSIS — Z7401 Bed confinement status: Secondary | ICD-10-CM | POA: Diagnosis not present

## 2020-01-11 DIAGNOSIS — I4892 Unspecified atrial flutter: Secondary | ICD-10-CM | POA: Diagnosis not present

## 2020-01-11 DIAGNOSIS — Z20822 Contact with and (suspected) exposure to covid-19: Secondary | ICD-10-CM | POA: Diagnosis present

## 2020-01-11 DIAGNOSIS — I509 Heart failure, unspecified: Secondary | ICD-10-CM

## 2020-01-11 DIAGNOSIS — R05 Cough: Secondary | ICD-10-CM | POA: Diagnosis not present

## 2020-01-11 DIAGNOSIS — N189 Chronic kidney disease, unspecified: Secondary | ICD-10-CM | POA: Diagnosis not present

## 2020-01-11 DIAGNOSIS — Z96643 Presence of artificial hip joint, bilateral: Secondary | ICD-10-CM | POA: Diagnosis present

## 2020-01-11 DIAGNOSIS — W19XXXA Unspecified fall, initial encounter: Secondary | ICD-10-CM | POA: Diagnosis present

## 2020-01-11 DIAGNOSIS — I5031 Acute diastolic (congestive) heart failure: Secondary | ICD-10-CM | POA: Diagnosis not present

## 2020-01-11 DIAGNOSIS — Z8249 Family history of ischemic heart disease and other diseases of the circulatory system: Secondary | ICD-10-CM | POA: Diagnosis not present

## 2020-01-11 DIAGNOSIS — Z515 Encounter for palliative care: Secondary | ICD-10-CM | POA: Diagnosis not present

## 2020-01-11 DIAGNOSIS — N179 Acute kidney failure, unspecified: Secondary | ICD-10-CM | POA: Diagnosis present

## 2020-01-11 DIAGNOSIS — I1310 Hypertensive heart and chronic kidney disease without heart failure, with stage 1 through stage 4 chronic kidney disease, or unspecified chronic kidney disease: Secondary | ICD-10-CM | POA: Diagnosis not present

## 2020-01-11 DIAGNOSIS — E785 Hyperlipidemia, unspecified: Secondary | ICD-10-CM | POA: Diagnosis present

## 2020-01-11 DIAGNOSIS — E119 Type 2 diabetes mellitus without complications: Secondary | ICD-10-CM

## 2020-01-11 DIAGNOSIS — I130 Hypertensive heart and chronic kidney disease with heart failure and stage 1 through stage 4 chronic kidney disease, or unspecified chronic kidney disease: Principal | ICD-10-CM | POA: Diagnosis present

## 2020-01-11 DIAGNOSIS — S32592A Other specified fracture of left pubis, initial encounter for closed fracture: Secondary | ICD-10-CM | POA: Diagnosis not present

## 2020-01-11 DIAGNOSIS — M25551 Pain in right hip: Secondary | ICD-10-CM | POA: Diagnosis not present

## 2020-01-11 DIAGNOSIS — I25119 Atherosclerotic heart disease of native coronary artery with unspecified angina pectoris: Secondary | ICD-10-CM | POA: Diagnosis present

## 2020-01-11 DIAGNOSIS — Z951 Presence of aortocoronary bypass graft: Secondary | ICD-10-CM

## 2020-01-11 DIAGNOSIS — Z79899 Other long term (current) drug therapy: Secondary | ICD-10-CM

## 2020-01-11 DIAGNOSIS — E875 Hyperkalemia: Secondary | ICD-10-CM | POA: Diagnosis not present

## 2020-01-11 DIAGNOSIS — R188 Other ascites: Secondary | ICD-10-CM | POA: Diagnosis present

## 2020-01-11 DIAGNOSIS — I071 Rheumatic tricuspid insufficiency: Secondary | ICD-10-CM | POA: Diagnosis present

## 2020-01-11 DIAGNOSIS — Z9114 Patient's other noncompliance with medication regimen: Secondary | ICD-10-CM

## 2020-01-11 DIAGNOSIS — R601 Generalized edema: Secondary | ICD-10-CM | POA: Diagnosis not present

## 2020-01-11 DIAGNOSIS — L89151 Pressure ulcer of sacral region, stage 1: Secondary | ICD-10-CM | POA: Diagnosis present

## 2020-01-11 DIAGNOSIS — N184 Chronic kidney disease, stage 4 (severe): Secondary | ICD-10-CM

## 2020-01-11 DIAGNOSIS — S32591A Other specified fracture of right pubis, initial encounter for closed fracture: Secondary | ICD-10-CM | POA: Diagnosis not present

## 2020-01-11 DIAGNOSIS — E8770 Fluid overload, unspecified: Secondary | ICD-10-CM | POA: Diagnosis not present

## 2020-01-11 DIAGNOSIS — E1122 Type 2 diabetes mellitus with diabetic chronic kidney disease: Secondary | ICD-10-CM | POA: Diagnosis present

## 2020-01-11 DIAGNOSIS — Z7189 Other specified counseling: Secondary | ICD-10-CM

## 2020-01-11 DIAGNOSIS — L8990 Pressure ulcer of unspecified site, unspecified stage: Secondary | ICD-10-CM | POA: Insufficient documentation

## 2020-01-11 DIAGNOSIS — I272 Pulmonary hypertension, unspecified: Secondary | ICD-10-CM

## 2020-01-11 DIAGNOSIS — I429 Cardiomyopathy, unspecified: Secondary | ICD-10-CM

## 2020-01-11 DIAGNOSIS — I5043 Acute on chronic combined systolic (congestive) and diastolic (congestive) heart failure: Secondary | ICD-10-CM | POA: Diagnosis present

## 2020-01-11 DIAGNOSIS — E1129 Type 2 diabetes mellitus with other diabetic kidney complication: Secondary | ICD-10-CM | POA: Diagnosis not present

## 2020-01-11 DIAGNOSIS — S32591B Other specified fracture of right pubis, initial encounter for open fracture: Secondary | ICD-10-CM | POA: Diagnosis present

## 2020-01-11 DIAGNOSIS — Z7982 Long term (current) use of aspirin: Secondary | ICD-10-CM

## 2020-01-11 DIAGNOSIS — M2550 Pain in unspecified joint: Secondary | ICD-10-CM | POA: Diagnosis not present

## 2020-01-11 DIAGNOSIS — R0902 Hypoxemia: Secondary | ICD-10-CM | POA: Diagnosis not present

## 2020-01-11 DIAGNOSIS — R262 Difficulty in walking, not elsewhere classified: Secondary | ICD-10-CM | POA: Diagnosis not present

## 2020-01-11 LAB — CBC WITH DIFFERENTIAL/PLATELET
Abs Immature Granulocytes: 0.35 10*3/uL — ABNORMAL HIGH (ref 0.00–0.07)
Basophils Absolute: 0 10*3/uL (ref 0.0–0.1)
Basophils Relative: 0 %
Eosinophils Absolute: 0.1 10*3/uL (ref 0.0–0.5)
Eosinophils Relative: 0 %
HCT: 32.3 % — ABNORMAL LOW (ref 39.0–52.0)
Hemoglobin: 10.3 g/dL — ABNORMAL LOW (ref 13.0–17.0)
Immature Granulocytes: 2 %
Lymphocytes Relative: 1 %
Lymphs Abs: 0.2 10*3/uL — ABNORMAL LOW (ref 0.7–4.0)
MCH: 26.7 pg (ref 26.0–34.0)
MCHC: 31.9 g/dL (ref 30.0–36.0)
MCV: 83.7 fL (ref 80.0–100.0)
Monocytes Absolute: 1 10*3/uL (ref 0.1–1.0)
Monocytes Relative: 6 %
Neutro Abs: 15.9 10*3/uL — ABNORMAL HIGH (ref 1.7–7.7)
Neutrophils Relative %: 91 %
Platelets: 297 10*3/uL (ref 150–400)
RBC: 3.86 MIL/uL — ABNORMAL LOW (ref 4.22–5.81)
RDW: 22.2 % — ABNORMAL HIGH (ref 11.5–15.5)
WBC: 17.6 10*3/uL — ABNORMAL HIGH (ref 4.0–10.5)
nRBC: 0.1 % (ref 0.0–0.2)

## 2020-01-11 LAB — COMPREHENSIVE METABOLIC PANEL
ALT: 28 U/L (ref 0–44)
AST: 41 U/L (ref 15–41)
Albumin: 3.4 g/dL — ABNORMAL LOW (ref 3.5–5.0)
Alkaline Phosphatase: 153 U/L — ABNORMAL HIGH (ref 38–126)
Anion gap: 10 (ref 5–15)
BUN: 152 mg/dL — ABNORMAL HIGH (ref 8–23)
CO2: 24 mmol/L (ref 22–32)
Calcium: 8.3 mg/dL — ABNORMAL LOW (ref 8.9–10.3)
Chloride: 96 mmol/L — ABNORMAL LOW (ref 98–111)
Creatinine, Ser: 3.78 mg/dL — ABNORMAL HIGH (ref 0.61–1.24)
GFR calc Af Amer: 17 mL/min — ABNORMAL LOW (ref >60)
GFR calc non Af Amer: 15 mL/min — ABNORMAL LOW (ref >60)
Glucose, Bld: 130 mg/dL — ABNORMAL HIGH (ref 70–99)
Potassium: 5.5 mmol/L — ABNORMAL HIGH (ref 3.5–5.1)
Sodium: 130 mmol/L — ABNORMAL LOW (ref 135–145)
Total Bilirubin: 1.3 mg/dL — ABNORMAL HIGH (ref 0.3–1.2)
Total Protein: 6 g/dL — ABNORMAL LOW (ref 6.5–8.1)

## 2020-01-11 LAB — GLUCOSE, CAPILLARY
Glucose-Capillary: 139 mg/dL — ABNORMAL HIGH (ref 70–99)
Glucose-Capillary: 121 mg/dL — ABNORMAL HIGH (ref 70–99)

## 2020-01-11 LAB — CREATININE, URINE, RANDOM: Creatinine, Urine: 63.1 mg/dL

## 2020-01-11 LAB — SODIUM, URINE, RANDOM: Sodium, Ur: <10 mmol/L

## 2020-01-11 MED ORDER — SODIUM CHLORIDE 0.9% FLUSH
3.0000 mL | Freq: Two times a day (BID) | INTRAVENOUS | Status: DC
  Administered 2020-01-11 – 2020-01-12 (×2): 3 mL via INTRAVENOUS

## 2020-01-11 MED ORDER — AMIODARONE HCL 200 MG PO TABS
200.0000 mg | ORAL_TABLET | Freq: Every day | ORAL | Status: DC
  Administered 2020-01-11 – 2020-01-12 (×2): 200 mg via ORAL
  Filled 2020-01-11 (×2): qty 1

## 2020-01-11 MED ORDER — CAMPHOR-MENTHOL 0.5-0.5 % EX LOTN
1.0000 "application " | TOPICAL_LOTION | Freq: Three times a day (TID) | CUTANEOUS | Status: DC | PRN
  Filled 2020-01-11: qty 222

## 2020-01-11 MED ORDER — ONDANSETRON HCL 4 MG/2ML IJ SOLN: 4.0000 mg | Freq: Four times a day (QID) | INTRAMUSCULAR | Status: DC | PRN

## 2020-01-11 MED ORDER — ACETAMINOPHEN 325 MG PO TABS: 650.0000 mg | ORAL_TABLET | Freq: Four times a day (QID) | ORAL | Status: DC | PRN

## 2020-01-11 MED ORDER — ONDANSETRON HCL 4 MG PO TABS: 4.0000 mg | ORAL_TABLET | Freq: Four times a day (QID) | ORAL | Status: DC | PRN

## 2020-01-11 MED ORDER — FUROSEMIDE 10 MG/ML IJ SOLN
100.0000 mg | Freq: Three times a day (TID) | INTRAVENOUS | Status: DC
  Administered 2020-01-11 – 2020-01-12 (×3): 100 mg via INTRAVENOUS
  Filled 2020-01-11 (×5): qty 10

## 2020-01-11 MED ORDER — SODIUM CHLORIDE 0.9 % IV SOLN
250.0000 mL | INTRAVENOUS | Status: DC | PRN
  Administered 2020-01-11: 22:00:00 250 mL via INTRAVENOUS

## 2020-01-11 MED ORDER — DOCUSATE SODIUM 283 MG RE ENEM
1.0000 | ENEMA | RECTAL | Status: DC | PRN
  Filled 2020-01-11: qty 1

## 2020-01-11 MED ORDER — INSULIN ASPART 100 UNIT/ML ~~LOC~~ SOLN
0.0000 [IU] | Freq: Three times a day (TID) | SUBCUTANEOUS | Status: DC
  Administered 2020-01-12: 2 [IU] via SUBCUTANEOUS
  Administered 2020-01-12: 1 [IU] via SUBCUTANEOUS

## 2020-01-11 MED ORDER — PANTOPRAZOLE SODIUM 40 MG PO TBEC
40.0000 mg | DELAYED_RELEASE_TABLET | Freq: Two times a day (BID) | ORAL | Status: DC
  Administered 2020-01-11 – 2020-01-12 (×2): 40 mg via ORAL
  Filled 2020-01-11 (×2): qty 1

## 2020-01-11 MED ORDER — METOPROLOL TARTRATE 50 MG PO TABS
50.0000 mg | ORAL_TABLET | Freq: Every day | ORAL | Status: DC
  Administered 2020-01-11: 50 mg via ORAL
  Filled 2020-01-11: qty 1

## 2020-01-11 MED ORDER — ASPIRIN EC 81 MG PO TBEC
81.0000 mg | DELAYED_RELEASE_TABLET | Freq: Every day | ORAL | Status: DC
  Administered 2020-01-12: 81 mg via ORAL
  Filled 2020-01-11 (×2): qty 1

## 2020-01-11 MED ORDER — SODIUM CHLORIDE 0.9% FLUSH: 3.0000 mL | INTRAVENOUS | Status: DC | PRN

## 2020-01-11 MED ORDER — ACETAMINOPHEN 650 MG RE SUPP: 650.0000 mg | Freq: Four times a day (QID) | RECTAL | Status: DC | PRN

## 2020-01-11 MED ORDER — ATORVASTATIN CALCIUM 40 MG PO TABS
40.0000 mg | ORAL_TABLET | Freq: Every day | ORAL | Status: DC
  Administered 2020-01-11: 40 mg via ORAL
  Filled 2020-01-11: qty 1

## 2020-01-11 MED ORDER — CALCIUM CARBONATE ANTACID 1250 MG/5ML PO SUSP
500.0000 mg | Freq: Four times a day (QID) | ORAL | Status: DC | PRN
  Filled 2020-01-11: qty 5

## 2020-01-11 MED ORDER — METOLAZONE 5 MG PO TABS
5.0000 mg | ORAL_TABLET | Freq: Every day | ORAL | Status: DC
  Administered 2020-01-11 – 2020-01-12 (×2): 5 mg via ORAL
  Filled 2020-01-11 (×2): qty 1

## 2020-01-11 MED ORDER — GUAIFENESIN-DM 100-10 MG/5ML PO SYRP
5.0000 mL | ORAL_SOLUTION | ORAL | Status: DC | PRN
  Administered 2020-01-11 – 2020-01-12 (×2): 5 mL via ORAL
  Filled 2020-01-11 (×2): qty 5

## 2020-01-11 MED ORDER — NEPRO/CARBSTEADY PO LIQD: 237.0000 mL | Freq: Three times a day (TID) | ORAL | Status: DC | PRN

## 2020-01-11 MED ORDER — ZOLPIDEM TARTRATE 5 MG PO TABS: 5.0000 mg | ORAL_TABLET | Freq: Every evening | ORAL | Status: DC | PRN

## 2020-01-11 MED ORDER — SORBITOL 70 % SOLN: 30.0000 mL | Status: DC | PRN

## 2020-01-11 MED ORDER — HYDROXYZINE HCL 25 MG PO TABS: 25.0000 mg | ORAL_TABLET | Freq: Three times a day (TID) | ORAL | Status: DC | PRN

## 2020-01-11 NOTE — Progress Notes (Signed)
Patient resting in bed, easily awakened from sleep. Patient is A&O x3 (disoriented to place and situation- says he is at someone's house to pick up materials). Patient is confused, but answers some questions appropriately.Patient has a cough that he says will not go away.

## 2020-01-11 NOTE — H&P (Signed)
History and Physical    AZTLAN COLL GEX:[id number] DOB: August 12, 1941 DOA: 01/11/2020  PCP: Cher Nakai, MD Patient coming from:  Home; NOK: Wife, Adair Lauderback, [phone]  Chief Complaint: fall  HPI: [name] is a 79 y.o. male with medical history significant of CAD s/p CABG; afib on ASA; CHF; DM; and B hip replacements who presented to [facility] on 2/18 after a fall that occurred on 2/17.   He is quite somnolent at this time and appears quite ill.  He opened his eyes to voice and touch and answered a few simple questions but generally slept throughout my evaluation.  I spoke with the patient's wife.  His wife reports that he has been sleepy and not very interactive for "quite a while."  No energy to do anything, just laid on the couch and slept.  She was not allowed to see him at [facility].  She was told that one of his heart valves was not working.  They had not discussed HD.  She has been seeing him going downhill, but he didn't want to go to the doctor.  [facility], per Dr. Dwyane Dee:    This 79 years old  male with multiple comorbid conditions admitted status post fall,  found to have multiple small pubic rami fractures,  ortho consulted suggests  Non operative management,  patient had worsening renal functions at admission,  cardiology consulted due for generalized anasarca ,  found to have severe tricuspid regurgitation on Echo contributing to ascites and worsening anasarca.  Patient underwent paracentesis 4.5 L clear fluid drained.  Cardiology suggests palliative care to discuss goals of care,  family agreed on DNR/ DNI but they want to transfer the patient to higher level of care.  Patient's creatinine continues to get worse with decreased urine output.  Discussed the case with Dr. Royce Macadamia at [facility] yesterday.  Patient is being transferred to [facility] for worsening renal function possible cardiorenal syndrome.  Please consult cardiology and nephrology after patient arrives.  Review of  Systems: Unable to perform   Past Medical History:  Diagnosis Date  . AKI (acute kidney injury) ([place]) 11/18/2016  . Atrial flutter ([place]) 11/17/2016  . CAD in native artery 04/19/2016   03/2008 CABG LIMA to LAD, SVG DIAG, OM1, OM2  . Chronic diastolic heart failure ([place]) 04/19/2016  . COPD (chronic obstructive pulmonary disease) ([place]) 07/25/2017  . DM (diabetes mellitus) ([place]) 07/25/2017  . Dyslipidemia 01/29/2018  . Essential hypertension 04/19/2016  . 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 Hyperlipidemia 04/19/2016  . Hypertensive heart disease with heart failure ([place]) 04/19/2016  . Hypoxemia 11/21/2016  . Lactic acidosis 08/20/2016  . Left upper lobe pneumonia 08/20/2016  . On amiodarone therapy 04/19/2016  . PAF (paroxysmal atrial fibrillation) ([place]) 04/19/2016    Past Surgical History:  Procedure Laterality Date  . BACK SURGERY    . CORONARY ARTERY BYPASS GRAFT    . ELECTROPHYSIOLOGIC STUDY     atrial tach/flutter  . TOTAL HIP REVISION      Social History   Socioeconomic History  . Marital status: Married    Spouse name: Not on file  . Number of children: Not on file  . Years of education: Not on file  . Highest education level: Not on file  Occupational History  . Not on file  Tobacco Use  . Smoking status: Never Smoker  . Smokeless tobacco: Never Used  Substance and Sexual Activity  . Alcohol use:  Never  . Drug use: Never  . Sexual activity: Not on file  Other Topics Concern  . Not on file  Social History Narrative  . Not on file   Social Determinants of Health   Financial Resource Strain:   . Difficulty of Paying Living Expenses: Not on file  Food Insecurity:   . Worried About Charity fundraiser in the Last Year: Not on file  . Ran Out of Food in the Last Year: Not on file  Transportation Needs:   . Lack of Transportation (Medical): Not on file  . Lack of Transportation (Non-Medical): Not on file  Physical Activity:   . Days of Exercise per Week: Not on file  .  Minutes of Exercise per Session: Not on file  Stress:   . Feeling of Stress : Not on file  Social Connections:   . Frequency of Communication with Friends and Family: Not on file  . Frequency of Social Gatherings with Friends and Family: Not on file  . Attends Religious Services: Not on file  . Active Member of Clubs or Organizations: Not on file  . Attends Archivist Meetings: Not on file  . Marital Status: Not on file  Intimate Partner Violence:   . Fear of Current or Ex-Partner: Not on file  . Emotionally Abused: Not on file  . Physically Abused: Not on file  . Sexually Abused: Not on file    No Known Allergies  Family History  Problem Relation Age of Onset  . Congestive Heart Failure Mother   . Heart attack Father     Prior to Admission medications   Medication Sig Start Date End Date Taking? Authorizing Provider  amiodarone (PACERONE) 200 MG tablet TAKE 1 TABLET (200 MG TOTAL) BY MOUTH DAILY. 08/20/19   Richardo Priest, MD  Ascorbic Acid (VITAMIN C) 1000 MG tablet Take 1,000 mg by mouth daily.    [provider]  aspirin EC 81 MG tablet Take 81 mg by mouth daily.    [provider]  atorvastatin (LIPITOR) 40 MG tablet TAKE 1 TABLET (40 MG TOTAL) BY MOUTH DAILY. 11/21/19   Richardo Priest, MD  metolazone (ZAROXOLYN) 5 MG tablet Take 1 tablet (5 mg total) by mouth every other day. 11/20/19 02/18/20  Richardo Priest, MD  metoprolol tartrate (LOPRESSOR) 100 MG tablet TAKE 1/2 TABLET EVERY DAY 01/01/20   Richardo Priest, MD  pantoprazole (PROTONIX) 40 MG tablet Take 40 mg by mouth 2 (two) times daily.     [provider]  torsemide (DEMADEX) 20 MG tablet TAKE 2 tablets 40mg  twice daily 11/13/19   Richardo Priest, MD  VITAMIN K PO Take 5,000 Units by mouth daily at 2 PM.    [provider]    Physical Exam: Vitals:   01/11/20 1528  BP: (!) 115/103  Pulse: 68  Resp: 18  Temp: 98.4 F (36.9 C)  TempSrc: Oral  SpO2: 94%  Weight: 97.1  kg  Height: 5\' 4"  (1.626 m)     . General:  Appears somnolent and quite ill . Eyes:  normal lids, iris . ENT:  grossly normal hearing, lips & tongue . Neck:  no LAD, masses or thyromegaly . Cardiovascular:  RRR, no m/r/g.  . Respiratory:   CTA bilaterally with no wheezes/rales/rhonchi.  Normal respiratory effort. . Abdomen:  soft, mildly diffusely TTP, ND, NABS, + mild-moderate ascites . Skin:  no rash or induration seen on limited exam; feet are cool  B and erythematous . Musculoskeletal:   no bony abnormality . Lower extremity:  2-3+ LE edema.  Limited foot exam with no ulcerations.  Pedal edema makes pulse palpation more challenging.  B distal feet are erythematous and appear to be hypersensitive to touch. . Psychiatric:  Somnolent, opens eyes to voice/touch/pain briefly . Neurologic:  Unable to perform    Radiological Exams from [facility]:  Chest x-ray: IMPRESSION: 1. Stable cardiac enlargement. 2. No definite acute rib fractures or pneumothorax. 3. Chronic pleural thickening bilaterally.  Right femur x-ray: IMPRESSION: No acute bony findings.  Bilateral hip with pelvic x-ray: IMPRESSION: No acute bony findings. Intact bilateral hip prostheses.  Head/cervical spine CT: IMPRESSION: No evidence of acute intracranial injury. Chronic microvascular ischemic changes. No acute cervical spine fracture.  Pelvis CT: IMPRESSION: 1. No acute abnormalities of the pelvis or hips. Bilateral total hip prostheses appear in good position. 2. Extensive ascites in the abdomen. Anasarca of both thighs. 3. Degenerative disc disease at L4-5 and L5-S1.  Chest CT: IMPRESSION: Cardiomegaly. Small effusions layering dependently. Interstitial pulmonary density favored to represent interstitial edema. Viral pneumonia is considered but not favored. Ascites. This could be due to early cirrhosis or possibly right heart failure. Question gallstones. Aortic Atherosclerosis (ICD10-I70.0).  Chest x-ray  01/06/2020: IMPRESSION: Increased bibasilar opacities are noted concerning for atelectasis or infiltrate.  MRI right hip: IMPRESSION: 1. Nondisplaced right sacral insufficiency fracture with surrounding marrow edema. 2. Subtle linear signal abnormality in the left pubic body with mild surrounding marrow edema concerning for a nondisplaced fracture. 3. Subacute fracture of the right superior pubic ramus-acetabular junction with mild marrow edema. 4. Bilateral total hip arthroplasties with susceptibility artifact obscuring the adjacent soft tissue and osseous structures. Intermediate signal in the right greater trochanter adjacent to the prosthesis concerning for particle disease.    EKG: Independently reviewed.  NSR with rate 63; nonspecific ST changes with no evidence of acute ischemia   Labs on Admission: I have personally reviewed the available labs and imaging studies at the time of the admission.  Pertinent labs:    Creatinine  1.90 > 2.0> 2.1> 2.7> 2.7> 2.8-> 3.1- 3.5 at [facility]  At [facility]: Na++ 130 K+ 5.5 Glucose 130 BUN 152/Creatinine 3.78/GFR 15 AP 153 WBC 17.6 Hgb 10.3   Assessment/Plan Principal Problem:   Cardiorenal syndrome with renal failure Active Problems:   Coronary artery disease involving native coronary artery of native heart with angina pectoris (HCC)   DM (diabetes mellitus) (HCC)   Dyslipidemia   Hyperlipidemia   PAF (paroxysmal atrial fibrillation) (HCC)   Bilateral pubic rami fractures, open, initial encounter ([place])   Acute on chronic combined systolic and diastolic CHF (congestive heart failure) (HCC)    B pubic rami fracture s/p fall -Patient was admitted after a fall at [facility] -Initial imaging was negative but subsequent imaging revealed pubic rami fractures -Orthopedics was consulted and these were thought to be non-operative -Plan was for pain management, PT/OT  Cardiorenal syndrome - stage IV CKD -Patient's renal dysfunction appears to be  more chronic than it would appear while at [facility] -Review of labs from January indicate consistently elevated creatinine with stage 4 CKD -He was seen by Dr. Bettina Gavia on 1/27 in clinic and noted to have worsened heart failure with refractory worsening renal function; he was taken off nephrotoxic medications (Metformin, NSAIDs) at that time and continued on torsemide -He has had low UOP while at [facility] -Renal US was unremarkable -Foley was placed for closer I/O monitoring -Nephrology consult pending -  Mild hyperkalemia -His AMS may be the result of uremia, but he does not appear to be a reasonable HD candidate until his cardiac issue is stabilized -Will admit to progressive care unit for close ongoing monitoring  Cardiorenal syndrome - acute on chronic combined CHF -Patient with anasarca while at [facility] -Echo was performed which showed EF 76% and diastolic dysfunction -He was initially treated with IVF but then changed to Lasix 40 mg IV BID; paracentesis was done with 4.5L drained off and so Lasix was held -Cardiology consult pending -Patient is DNR but was transferred to [facility] for further evaluation and management -Negative troponins at [facility] -Severe tricuspid regurgitation on echo, indicating a poor prognosis -He appears to have diminishing pedal circulation, although not distal ischemia at this time -He does not appear to be a good candidate for valvular repair/replacement at this time  Anasarca -Significant LE edema -s/p 4.5L paracentesis drainage at [facility] with ongoing ascites  PAF/h/o CAD -Rate controlled with Amio, metoprolol; will continue -No AC due to h/o GI bleeding (per RH) -Continue ASA  HLD -Continue Lipitor  DM -hold Glimepride, Januvia -Cover with sensitive-scale SSI    Note: This patient has been tested and is negative for the novel coronavirus COVID-19.  DVT prophylaxis: SCDs Code Status:  DNR - confirmed with patient/family at [place] Communication: None present; I spoke with  the patient's wife by telephone at the time of admission. Disposition Plan: Prognosis is guarded to poor at this time.  Disposition is uncertain Consults called: Cardiology; nephrology Admission status: Admit - It is my clinical opinion that admission to INPATIENT is reasonable and necessary because of the expectation that this patient will require hospital care that crosses at least 2 midnights to treat this condition based on the medical complexity of the problems presented.  Given the aforementioned information, the predictability of an adverse outcome is felt to be significant.    Karmen Bongo MD [facility]   How to contact the [facility] Attending or Consulting provider [place] or covering provider during after hours [place], for this patient?  1. Check the care team in [facility] and look for a) attending/consulting TRH provider listed and b) the [facility] team listed 2. Log into [email or web address] and use [place]'s universal password to access. If you do not have the password, please contact the hospital operator. 3. Locate the [facility] provider you are looking for under [facility] and page to a number that you can be directly reached. 4. If you still have difficulty reaching the provider, please page the [facility] (Director on Call) for the Hospitalists listed on amion for assistance.   01/11/2020, 6:37 PM

## 2020-01-11 NOTE — Progress Notes (Signed)
Per report, foley catheter placed yesterday and urine is still bloody.

## 2020-01-11 NOTE — Consult Note (Addendum)
Cardiology Consultation:   Patient ID: Antonio Snyder MRN: [id number]; DOB: [birth year]  Admit date: 01/11/2020 Date of Consult: 01/11/2020  Primary Care Provider: Cher Nakai, MD Primary Cardiologist: Shirlee More, MD   Patient Profile:   Antonio Snyder is a 79 y.o. male with a hx of  CAD s/p CABG, a fib on ASA, CHF, DM, COPD CKD-3 and bilateral hip replacements and transferred to [facility] from [place] admitted after a fall  He was transferred to [facility] for continued care   Cardiology asked to see him today for the evaluation of severe TR and anasarca, at the request of Dr. Eliseo Squires.  History of Present Illness:   Antonio Snyder is followed by Shirlee More in cardiology.  He was last seen in office on 12/11/19 At that time he had worsened heart failure and renal function. He was in SR  In the progress note it was noted that he was taking several medicines he was not supposed to, despite instruction from previous clinic visit to stop  AN echo was orderedt.  Pro BNP at that time was 6,279.  BUN 105 Cr 3.15.   (Cr 2.56 12/20)   The pt was admitted to [facility] on 2/18 after a fall on 2/17  Found to have multiple pubic fractures, not operative  Echo was dnoe    By report showed severe TR He underwent  paracentesis with 4.5L removed (clear fluid).  Cardiology was consulted and recomm for palliative care was mad with pt becoming  DNR/DNI\ Family wanted the pt to be transferred to higher level of care. He was brought to [facility] for Rx of progressive renal dysfunction and CHFNote:   Venous doppler ng for DVT.    Cr 1.9> 2.1> 2.7> 2.7>2.8> 3.1  The pt is not very communicative   Moans to questions   COmfortable in bed    EKG:  SR 63 bpm   Low voltage EKG   Telemetry:  Telemetry was personally reviewed and demonstrates:  SR 63     Heart Pathway Score:     Past Medical History:  Diagnosis Date  . AKI (acute kidney injury) ([place]) 11/18/2016  . Atrial flutter ([place]) 11/17/2016  . CAD in native artery  04/19/2016   03/2008 CABG LIMA to LAD, SVG DIAG, OM1, OM2  . Chronic diastolic heart failure ([place]) 04/19/2016  . COPD (chronic obstructive pulmonary disease) ([place]) 07/25/2017  . DM (diabetes mellitus) ([place]) 07/25/2017  . Dyslipidemia 01/29/2018  . Essential hypertension 04/19/2016  . 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 Hyperlipidemia 04/19/2016  . Hypertensive heart disease with heart failure ([place]) 04/19/2016  . Hypoxemia 11/21/2016  . Lactic acidosis 08/20/2016  . Left upper lobe pneumonia 08/20/2016  . On amiodarone therapy 04/19/2016  . PAF (paroxysmal atrial fibrillation) ([place]) 04/19/2016    Past Surgical History:  Procedure Laterality Date  . BACK SURGERY    . CORONARY ARTERY BYPASS GRAFT    . ELECTROPHYSIOLOGIC STUDY     atrial tach/flutter  . TOTAL HIP REVISION         Inpatient Medications: Scheduled Meds: . amiodarone  200 mg Oral Daily  . aspirin EC  81 mg Oral Daily  . atorvastatin  40 mg Oral QHS  . [START ON 01/12/2020] insulin aspart  0-9 Units Subcutaneous TID WC  . metoprolol tartrate  50 mg Oral QHS  . pantoprazole  40 mg Oral BID  . sodium chloride flush  3 mL Intravenous Q12H  Continuous Infusions: . sodium chloride     PRN Meds: sodium chloride, acetaminophen **OR** acetaminophen, calcium carbonate (dosed in mg elemental calcium), camphor-menthol **AND** hydrOXYzine, docusate sodium, feeding supplement (NEPRO CARB STEADY), ondansetron **OR** ondansetron (ZOFRAN) IV, sodium chloride flush, sorbitol, zolpidem  Allergies:   No Known Allergies  Social History:   Social History   Socioeconomic History  . Marital status: Married    Spouse name: Not on file  . Number of children: Not on file  . Years of education: Not on file  . Highest education level: Not on file  Occupational History  . Not on file  Tobacco Use  . Smoking status: Never Smoker  . Smokeless tobacco: Never Used  Substance and Sexual Activity  . Alcohol use: Never  . Drug use: Never  .  Sexual activity: Not on file  Other Topics Concern  . Not on file  Social History Narrative  . Not on file   Social Determinants of Health   Financial Resource Strain:   . Difficulty of Paying Living Expenses: Not on file  Food Insecurity:   . Worried About Charity fundraiser in the Last Year: Not on file  . Ran Out of Food in the Last Year: Not on file  Transportation Needs:   . Lack of Transportation (Medical): Not on file  . Lack of Transportation (Non-Medical): Not on file  Physical Activity:   . Days of Exercise per Week: Not on file  . Minutes of Exercise per Session: Not on file  Stress:   . Feeling of Stress : Not on file  Social Connections:   . Frequency of Communication with Friends and Family: Not on file  . Frequency of Social Gatherings with Friends and Family: Not on file  . Attends Religious Services: Not on file  . Active Member of Clubs or Organizations: Not on file  . Attends Archivist Meetings: Not on file  . Marital Status: Not on file  Intimate Partner Violence:   . Fear of Current or Ex-Partner: Not on file  . Emotionally Abused: Not on file  . Physically Abused: Not on file  . Sexually Abused: Not on file    Family History:    Family History  Problem Relation Age of Onset  . Congestive Heart Failure Mother   . Heart attack Father      ROS:  Please see the history of present illness.  Patient not communicating for further questioning      Physical Exam/Data:   Vitals:   01/11/20 1528  BP: (!) 115/103  Pulse: 68  Resp: 18  Temp: 98.4 F (36.9 C)  TempSrc: Oral  SpO2: 94%  Weight: 97.1 kg  Height: 5\' 4"  (1.626 m)    Intake/Output Summary (Last 24 hours) at 01/11/2020 1949 Last data filed at 01/11/2020 1549 Gross per 24 hour  Intake --  Output 150 ml  Net -150 ml   Last 3 Weights 01/11/2020 12/11/2019 11/20/2019  Weight (lbs) 214 lb 1.1 oz 218 lb 209 lb  Weight (kg) 97.1 kg 98.884 kg 94.802 kg     Body mass index is  36.74 kg/m.  General:  Obese 79 yo n no acute distress HEENT: normal Lymph: no adenopathy Neck: neck full   JVP does appear increase  No bruits  Vascular: No carotid bruits; FA pulses 2+ bilaterally Cardiac:  normal S1, S2; RRR;II/VI systolic murmur LSB  Distant Lungs:  Rel clear anterior  Abd: distended  No masses  Mild RUQ tenderness Ext: 1+ edema  Feet cool Musculoskeletal:  No deformities, Moving all extremities Skin  [place] arms   Feel cool Neuro:   Deferred as pt not cooperative  Psych:  Deferred as pt not cooperative    EKG:  SR 63 bpm   Low voltage EKG   Telemetry:  Telemetry was personally reviewed and demonstrates:  SR 63    Relevant CV Studies Echo ordered   Laboratory Data:  High Sensitivity Troponin:  No results for input(s): TROPONINIHS in the last 720 hours.   Chemistry Recent Labs  Lab 01/11/20 1634  NA 130*  K 5.5*  CL 96*  CO2 24  GLUCOSE 130*  BUN 152*  CREATININE 3.78*  CALCIUM 8.3*  GFRNONAA 15*  GFRAA 17*  ANIONGAP 10    Recent Labs  Lab 01/11/20 1634  PROT 6.0*  ALBUMIN 3.4*  AST 41  ALT 28  ALKPHOS 153*  BILITOT 1.3*   Hematology Recent Labs  Lab 01/11/20 1634  WBC 17.6*  RBC 3.86*  HGB 10.3*  HCT 32.3*  MCV 83.7  MCH 26.7  MCHC 31.9  RDW 22.2*  PLT 297   BNPNo results for input(s): BNP, PROBNP in the last 168 hours.  DDimer No results for input(s): DDIMER in the last 168 hours.   Radiology/Studies:  No results found.       Assessment and Plan:   1.   CHF Sallyanne Havers   PT has hx of edema and noncompliance   Now transferred for further care    Pt with severe volume increase   Feet cool, pt clamped down Overall, treatment will be limited by renal  Dysfunction  Renal service to see. In AM would get echo to document LV and RV function as well as valvular function  May represent endstage RV dysfunction in setting of severe TR  May benefit from inotropic support (would recom PICC placment to follow)  2  Hx of severe  TR  Echo ordered to assess RV size/function    3  CAD   Pt with remote CABG (2009:  LIMA to LAD; SVG to DIag;, OM1, OM2)   Follow   No evid for active ischemia  4   Hx HTN  BP 110s/   Follow   5  Atrial fibrillation  Currently in SR Continue amiodarone   6  Renal    Cr 3.78  Up from 3.1 in Jan   Renal service contacted   7  Neuro  Pt's with minimal responses     For questions or updates, please contact [facility] Please consult [email or web address] for contact info under      Signed, Cecilie Kicks, NP  01/11/2020 4:57 PM   Pt seen and examined.  I have modified note above, performed elements of physical exam   Will continue to follow   Dorris Carnes MD 01/11/2020

## 2020-01-11 NOTE — Consult Note (Addendum)
Renal Service Consult Note [facility]  [name] 01/11/2020 Sol Blazing Requesting Physician:  Dr Lorin Mercy  Reason for Consult:  CKD IV patient w/ vol overload HPI: The patient is a 79 y.o. year-old with hx of PAF, HHD w/ CHF, HL, h/o CABG, HTN, DM2 , COPD, chron diast CHF and CKD IV, bilat THR who was admitted to [facility] on 2/18 after a fall.  Was found to have pubic rami fx's. While there had worsening edema/ anasarca, severe TR by echo, also ascites.  Underwent paracentesis for 4.5 L.  Seen by pall care and made DNI/ DNR.  Creat worsening so tx'd to [facility].    Pt seen in room. Agitated and asking for a bedpan to have a BM.  Poor/ vague historian.   From [facility] >>   CXR  01/06/2020: IMPRESSION: Increased bibasilar opacities are noted concerning for atelectasis or infiltrate.   Chest CT: IMPRESSION: Cardiomegaly. Small effusions layering dependently. Interstitial pulmonary density favored to represent interstitial edema. Viral pneumonia is considered but not favored. Ascites. This could be due to early cirrhosis or possibly right heart failure. Question gallstones.   Pelvis CT: IMPRESSION: 1. No acute abnormalities of the pelvis or hips. Bilateral total hip prostheses appear in good position. 2. Extensive ascites in the abdomen. Anasarca of both thighs. 3. Degenerative disc disease at L4-5 and L5-S1.   ROS - n/a, confused   Past Medical History  Past Medical History:  Diagnosis Date  . AKI (acute kidney injury) ([place]) 11/18/2016  . Atrial flutter ([place]) 11/17/2016  . CAD in native artery 04/19/2016   03/2008 CABG LIMA to LAD, SVG DIAG, OM1, OM2  . Chronic diastolic heart failure ([place]) 04/19/2016  . COPD (chronic obstructive pulmonary disease) ([place]) 07/25/2017  . DM (diabetes mellitus) ([place]) 07/25/2017  . Dyslipidemia 01/29/2018  . Essential hypertension 04/19/2016  . 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 Hyperlipidemia 04/19/2016  . Hypertensive heart disease with heart  failure ([place]) 04/19/2016  . Hypoxemia 11/21/2016  . Lactic acidosis 08/20/2016  . Left upper lobe pneumonia 08/20/2016  . On amiodarone therapy 04/19/2016  . PAF (paroxysmal atrial fibrillation) ([place]) 04/19/2016   Past Surgical History  Past Surgical History:  Procedure Laterality Date  . BACK SURGERY    . CORONARY ARTERY BYPASS GRAFT    . ELECTROPHYSIOLOGIC STUDY     atrial tach/flutter  . TOTAL HIP REVISION     Family History  Family History  Problem Relation Age of Onset  . Congestive Heart Failure Mother   . Heart attack Father    Social History  reports that he has never smoked. He has never used smokeless tobacco. He reports that he does not drink alcohol or use drugs. Allergies No Known Allergies Home medications Prior to Admission medications   Medication Sig Start Date End Date Taking? Authorizing Provider  amiodarone (PACERONE) 200 MG tablet TAKE 1 TABLET (200 MG TOTAL) BY MOUTH DAILY. 08/20/19  Yes Richardo Priest, MD  Ascorbic Acid (VITAMIN C) 1000 MG tablet Take 1,000 mg by mouth daily.   Yes [provider]  aspirin EC 81 MG tablet Take 81 mg by mouth at bedtime.    Yes [provider]  atorvastatin (LIPITOR) 40 MG tablet TAKE 1 TABLET (40 MG TOTAL) BY MOUTH DAILY. Patient taking differently: Take 40 mg by mouth at bedtime.  11/21/19  Yes Richardo Priest, MD  glimepiride (AMARYL) 4 MG tablet Take 4 mg by mouth daily  with breakfast.   Yes [provider]  metoprolol tartrate (LOPRESSOR) 100 MG tablet TAKE 1/2 TABLET EVERY DAY Patient taking differently: Take 50 mg by mouth at bedtime.  01/01/20  Yes Richardo Priest, MD  neomycin-bacitracin-polymyxin (NEOSPORIN) ointment Apply 1 application topically every other day. Apply to leg wounds   Yes [provider]  pantoprazole (PROTONIX) 40 MG tablet Take 40 mg by mouth 2 (two) times daily.    Yes [provider]  sitaGLIPtin (JANUVIA) 50 MG tablet Take 50 mg by mouth daily.   Yes [provider]  torsemide (DEMADEX) 20 MG tablet TAKE 2 tablets 44m twice daily Patient taking differently: Take 40 mg by mouth 2 (two) times daily.  11/13/19  Yes MRichardo Priest MD  VITAMIN K PO Take 5,000 Units by mouth daily at 2 PM.   Yes [provider]     Vitals:   01/11/20 1528  BP: (!) 115/103  Pulse: 68  Resp: 18  Temp: 98.4 F (36.9 C)  TempSrc: Oral  SpO2: 94%  Weight: 97.1 kg  Height: 5' 4"  (1.626 m)   Exam Gen elderly , deconditioned, +mild ^wob No rash, cyanosis or gangrene Sclera anicteric, throat clear  No jvd or bruits Chest clear on R, dec'd L base RRR no MRG Abd soft ntnd no mass or ascites +bs GU normal male w/ foley cath in place MS no joint effusions or deformity Ext diffuse 2-3+ LE edema, 1-2+ UE edema Neuro is a bit agitated, not responding to questions due to agitation, moving UE's     Home meds:  - amiodarone 200  - sitagliptin 50 qd/ glimepiride 4 mg qam  - metoprolol 50 qhs/ torsemide 40 bid  - aspirin 81/ atorvastatin 40 hs  - vit K 5000 units qd  - prn's/ vitamins/ supplements     Date   Creat  eGFR   Aug 2019  1.43  48   Dec 2020  2.2- 2.5 23- 11 Dec 2019  2.66- 3.81 14- 22   Jan 11, 2020  3.78  15     Assessment/ Plan: 1. CKD IV - due to DM/ HTN, +/- cardiorenal. No echo data here. +azotemia w/ BUN 152. Get UA, urine lytes and renal UKoreain am.  Poor prognosis.  Will attempt to diurese w/ high dose IV lasix and zaroxlyn po. Will follow.  2. Vol overload - sig edema / ascites, R HF. No edema on outside hosp CXR's.  Get CXR here.  3. Mild hyperkalemia - K 5.5, renal diet 4. Atrial fib/ flutter 5. DM - non insulin dep 6. H/o CABG 7. DNR/ DNI      RKelly Splinter MD 01/11/2020, 8:01 PM  Recent Labs  Lab 01/11/20 1634  WBC 17.6*  HGB 10.3*   Recent Labs  Lab 01/11/20 1634  K 5.5*  BUN 152*  CREATININE 3.78*  CALCIUM 8.3*

## 2020-01-12 ENCOUNTER — Inpatient Hospital Stay (HOSPITAL_COMMUNITY): Payer: Medicare HMO

## 2020-01-12 DIAGNOSIS — S32591B Other specified fracture of right pubis, initial encounter for open fracture: Secondary | ICD-10-CM

## 2020-01-12 DIAGNOSIS — Z7189 Other specified counseling: Secondary | ICD-10-CM

## 2020-01-12 DIAGNOSIS — I5031 Acute diastolic (congestive) heart failure: Secondary | ICD-10-CM

## 2020-01-12 DIAGNOSIS — S32592B Other specified fracture of left pubis, initial encounter for open fracture: Secondary | ICD-10-CM

## 2020-01-12 DIAGNOSIS — I480 Paroxysmal atrial fibrillation: Secondary | ICD-10-CM

## 2020-01-12 DIAGNOSIS — I1310 Hypertensive heart and chronic kidney disease without heart failure, with stage 1 through stage 4 chronic kidney disease, or unspecified chronic kidney disease: Secondary | ICD-10-CM

## 2020-01-12 DIAGNOSIS — Z515 Encounter for palliative care: Secondary | ICD-10-CM

## 2020-01-12 DIAGNOSIS — Z66 Do not resuscitate: Secondary | ICD-10-CM

## 2020-01-12 DIAGNOSIS — L8990 Pressure ulcer of unspecified site, unspecified stage: Secondary | ICD-10-CM | POA: Insufficient documentation

## 2020-01-12 DIAGNOSIS — I5043 Acute on chronic combined systolic (congestive) and diastolic (congestive) heart failure: Secondary | ICD-10-CM

## 2020-01-12 DIAGNOSIS — N184 Chronic kidney disease, stage 4 (severe): Secondary | ICD-10-CM

## 2020-01-12 LAB — CBC WITH DIFFERENTIAL/PLATELET
Abs Immature Granulocytes: 0.23 10*3/uL — ABNORMAL HIGH (ref 0.00–0.07)
Basophils Absolute: 0 10*3/uL (ref 0.0–0.1)
Basophils Relative: 0 %
Eosinophils Absolute: 0 10*3/uL (ref 0.0–0.5)
Eosinophils Relative: 0 %
HCT: 29.6 % — ABNORMAL LOW (ref 39.0–52.0)
Hemoglobin: 9.5 g/dL — ABNORMAL LOW (ref 13.0–17.0)
Immature Granulocytes: 1 %
Lymphocytes Relative: 2 %
Lymphs Abs: 0.3 10*3/uL — ABNORMAL LOW (ref 0.7–4.0)
MCH: 27.1 pg (ref 26.0–34.0)
MCHC: 32.1 g/dL (ref 30.0–36.0)
MCV: 84.3 fL (ref 80.0–100.0)
Monocytes Absolute: 1.2 10*3/uL — ABNORMAL HIGH (ref 0.1–1.0)
Monocytes Relative: 7 %
Neutro Abs: 15.8 10*3/uL — ABNORMAL HIGH (ref 1.7–7.7)
Neutrophils Relative %: 90 %
Platelets: 271 10*3/uL (ref 150–400)
RBC: 3.51 MIL/uL — ABNORMAL LOW (ref 4.22–5.81)
RDW: 22.5 % — ABNORMAL HIGH (ref 11.5–15.5)
WBC: 17.5 10*3/uL — ABNORMAL HIGH (ref 4.0–10.5)
nRBC: 0.1 % (ref 0.0–0.2)

## 2020-01-12 LAB — URINALYSIS, ROUTINE W REFLEX MICROSCOPIC
Bilirubin Urine: NEGATIVE
Glucose, UA: NEGATIVE mg/dL
Ketones, ur: NEGATIVE mg/dL
Nitrite: NEGATIVE
Protein, ur: 100 mg/dL — AB
RBC / HPF: >50 RBC/hpf — ABNORMAL HIGH (ref 0–5)
Specific Gravity, Urine: 1.011 (ref 1.005–1.030)
WBC, UA: >50 WBC/hpf — ABNORMAL HIGH (ref 0–5)
pH: 5 (ref 5.0–8.0)

## 2020-01-12 LAB — GLUCOSE, CAPILLARY
Glucose-Capillary: 143 mg/dL — ABNORMAL HIGH (ref 70–99)
Glucose-Capillary: 197 mg/dL — ABNORMAL HIGH (ref 70–99)
Glucose-Capillary: 106 mg/dL — ABNORMAL HIGH (ref 70–99)

## 2020-01-12 LAB — BASIC METABOLIC PANEL
Anion gap: 18 — ABNORMAL HIGH (ref 5–15)
BUN: 154 mg/dL — ABNORMAL HIGH (ref 8–23)
CO2: 21 mmol/L — ABNORMAL LOW (ref 22–32)
Calcium: 8.3 mg/dL — ABNORMAL LOW (ref 8.9–10.3)
Chloride: 91 mmol/L — ABNORMAL LOW (ref 98–111)
Creatinine, Ser: 3.78 mg/dL — ABNORMAL HIGH (ref 0.61–1.24)
GFR calc Af Amer: 17 mL/min — ABNORMAL LOW (ref >60)
GFR calc non Af Amer: 15 mL/min — ABNORMAL LOW (ref >60)
Glucose, Bld: 146 mg/dL — ABNORMAL HIGH (ref 70–99)
Potassium: 5 mmol/L (ref 3.5–5.1)
Sodium: 130 mmol/L — ABNORMAL LOW (ref 135–145)

## 2020-01-12 LAB — HEPATIC FUNCTION PANEL
ALT: 28 U/L (ref 0–44)
AST: 38 U/L (ref 15–41)
Albumin: 3.2 g/dL — ABNORMAL LOW (ref 3.5–5.0)
Alkaline Phosphatase: 147 U/L — ABNORMAL HIGH (ref 38–126)
Bilirubin, Direct: 0.5 mg/dL — ABNORMAL HIGH (ref 0.0–0.2)
Indirect Bilirubin: 0.9 mg/dL (ref 0.3–0.9)
Total Bilirubin: 1.4 mg/dL — ABNORMAL HIGH (ref 0.3–1.2)
Total Protein: 5.7 g/dL — ABNORMAL LOW (ref 6.5–8.1)

## 2020-01-12 LAB — ECHOCARDIOGRAM COMPLETE
Height: 64 in
Weight: 3453.29 oz

## 2020-01-12 LAB — SARS CORONAVIRUS 2 (TAT 6-24 HRS): SARS Coronavirus 2: NEGATIVE

## 2020-01-12 MED ORDER — CHLORHEXIDINE GLUCONATE CLOTH 2 % EX PADS: 6.0000 | MEDICATED_PAD | Freq: Every day | CUTANEOUS | Status: DC

## 2020-01-12 MED ORDER — INFLUENZA VAC A&B SA ADJ QUAD 0.5 ML IM PRSY: 0.5000 mL | PREFILLED_SYRINGE | INTRAMUSCULAR | Status: DC

## 2020-01-12 MED ORDER — BISACODYL 10 MG RE SUPP: 10.0000 mg | Freq: Every day | RECTAL | Status: DC | PRN

## 2020-01-12 MED ORDER — LORAZEPAM 2 MG/ML IJ SOLN: 0.5000 mg | INTRAMUSCULAR | Status: DC | PRN

## 2020-01-12 MED ORDER — GLYCOPYRROLATE 0.2 MG/ML IJ SOLN
0.4000 mg | INTRAMUSCULAR | Status: DC | PRN
  Filled 2020-01-12: qty 2

## 2020-01-12 MED ORDER — ARTIFICIAL TEARS OPHTHALMIC OINT
TOPICAL_OINTMENT | OPHTHALMIC | Status: DC | PRN
  Filled 2020-01-12: qty 3.5

## 2020-01-12 MED ORDER — HYDROMORPHONE HCL 1 MG/ML IJ SOLN: 0.5000 mg | INTRAMUSCULAR | Status: DC | PRN

## 2020-01-12 MED ORDER — MORPHINE SULFATE 20 MG/5ML PO SOLN: 10.0000 mg | ORAL | 0 refills | Status: AC | PRN

## 2020-01-12 MED ORDER — BIOTENE DRY MOUTH MT LIQD: 15.0000 mL | OROMUCOSAL | Status: DC | PRN

## 2020-01-12 NOTE — Discharge Summary (Signed)
Physician Discharge Summary  DIN BOOKWALTER IRC:[id number] DOB: [birth year] DOA: 01/11/2020  PCP: Cher Nakai, MD  Admit date: 01/11/2020 Discharge date: 01/12/2020  Admitted From: Home Disposition:  Home  Recommendations for Outpatient Follow-up:  1. Hospice to follow-up with the patient as an outpatient.  Home Health:No Equipment/Devices:none  Discharge Condition:Hospice CODE STATUS:DNR Diet recommendation: Heart Healthy   Brief/Interim Summary: 79 y.o. male past medical history of CAD status post CABG, atrial fibrillation on aspirin, and noncompliant with his medication, bilateral hip replacement transferred to [facility] from [place] after a fall and found to have multiple pubic fractures that was none-operative.  During his admission at [facility] he was found to have worsening edema an echo was done during that time did show severe TR he had ascites he underwent paracentesis and removed 4.5 L, cardiology was consulted and palliative care was consulted and patient was made DNR/DNI.  Patient requested transfer to higher level of care lower extremity Doppler was negative. On 12/11/2019 at that time he had worsening heart failure renal function he was in sinus rhythm he was taking his medication inappropriately.  At that time his creatinine was 3.1 which he has steadily been climbing for the last several months  Discharge Diagnoses:  Principal Problem:   Cardiorenal syndrome with renal failure Active Problems:   Coronary artery disease involving native coronary artery of native heart with angina pectoris (HCC)   DM (diabetes mellitus) ([place])   Dyslipidemia   Hyperlipidemia   PAF (paroxysmal atrial fibrillation) (HCC)   Bilateral pubic rami fractures, open, initial encounter ([place])   Acute on chronic combined systolic and diastolic CHF (congestive heart failure) (HCC)   Pressure injury of skin   Palliative care by specialist   Goals of care, counseling/discussion   DNR (do not  resuscitate)  Bilateral pubic ramus fracture status post fall: Orthopedic surgery was consulted and recommended conservative management.  Cardiorenal syndrome with acute kidney injury on chronic kidney disease stage IV/anasarca: Renal ultrasound was done which was unremarkable nephrology and cardiology was consulted, urine lites urinary sodium less than 10 his urinary creatinine was 63. Nephrology started the patient on IV Lasix and Zaroxolyn with poor urine output and ongoing hyaline of his creatinine. Patient is volume overloaded significantly of physical exam. Nephrology recommended hospice care. After having a long discussion with the family and the patient they decided to move towards comfort care. They would like no further lab draws or interventions they would like all medications DC'd and he would like to go home with hospice and made comfortable.  Paroxysmal atrial fibrillation: All medications were DC'd.  As per family's request.  Hyperlipidemia Diabetes mellitus type 2 Noted.  Present on admission stage I sacral decubitus ulcer. Discharge Instructions   Allergies as of 01/12/2020   No Known Allergies     Medication List    STOP taking these medications   amiodarone 200 MG tablet Commonly known as: PACERONE   aspirin EC 81 MG tablet   atorvastatin 40 MG tablet Commonly known as: LIPITOR   glimepiride 4 MG tablet Commonly known as: AMARYL   metoprolol tartrate 100 MG tablet Commonly known as: LOPRESSOR   neomycin-bacitracin-polymyxin ointment Commonly known as: NEOSPORIN   pantoprazole 40 MG tablet Commonly known as: PROTONIX   sitaGLIPtin 50 MG tablet Commonly known as: JANUVIA   torsemide 20 MG tablet Commonly known as: DEMADEX   vitamin C 1000 MG tablet   VITAMIN K PO     TAKE these medications   morphine  20 MG/5ML solution Take 2.5 mLs (10 mg total) by mouth every 2 (two) hours as needed for pain (or shortness of breath).       No Known  Allergies  Consultations: nephro cards  Procedures/Studies: US RENAL  Result Date: 01/12/2020 CLINICAL DATA:  Chronic kidney disease. EXAM: RENAL / URINARY TRACT ULTRASOUND COMPLETE COMPARISON:  01/06/2020 FINDINGS: Right Kidney: Renal measurements: 10.7 x 5.7 x 6.0 cm = volume: 191 mL . Echogenicity within normal limits. No mass or hydronephrosis visualized. Left Kidney: Renal measurements: 11.1 x 5.8 x 6.2 cm = volume: 208 mL. Echogenicity within normal limits. No mass or hydronephrosis visualized. Bladder: Decompressed by Foley catheter. Other: None. IMPRESSION: No evidence for hydronephrosis. Electronically Signed   By: Misty Stanley M.D.   On: 01/12/2020 04:56   DG CHEST PORT 1 VIEW  Result Date: 01/11/2020 CLINICAL DATA:  Short of breath, productive cough EXAM: PORTABLE CHEST 1 VIEW COMPARISON:  01/06/2020 FINDINGS: Single frontal view of the chest demonstrates persistent enlargement of the cardiac silhouette. Postsurgical changes from median sternotomy. There is chronic central vascular congestion, with persistent bibasilar consolidation. There is now superimposed ground-glass airspace disease at the lung bases. Trace bilateral pleural effusions. No pneumothorax. IMPRESSION: 1. Findings most consistent with mild congestive heart failure. Slight worsening of volume status since prior study. Electronically Signed   By: Randa Ngo M.D.   On: 01/11/2020 21:45    Subjective: Patient relates he feels tired and fatigued he is tired of doing this.  Discharge Exam: Vitals:   01/12/20 0549 01/12/20 0757  BP:  (!) 94/50  Pulse: 61 (!) 58  Resp:  14  Temp:  97.8 F (36.6 C)  SpO2: 95% 96%   Vitals:   01/12/20 0548 01/12/20 0549 01/12/20 0603 01/12/20 0757  BP: (!) 97/51   (!) 94/50  Pulse: 62 61  (!) 58  Resp: 18   14  Temp: 98.2 F (36.8 C)   97.8 F (36.6 C)  TempSrc: Oral   Oral  SpO2: 93% 95%  96%  Weight:   97.9 kg   Height:        General: Pt is alert, awake, not in  acute distress Cardiovascular: RRR, S1/S2 +, no rubs, no gallops Respiratory: CTA bilaterally, no wheezing, no rhonchi Abdominal: Soft, NT, ND, bowel sounds + Extremities: no edema, no cyanosis    The results of significant diagnostics from this hospitalization (including imaging, microbiology, ancillary and laboratory) are listed below for reference.     Microbiology: Recent Results (from the past 240 hour(s))  SARS CORONAVIRUS 2 (TAT 6-24 HRS) Nasopharyngeal Nasopharyngeal Swab     Status: None   Collection Time: 01/11/20  8:05 PM   Specimen: Nasopharyngeal Swab  Result Value Ref Range Status   SARS Coronavirus 2 NEGATIVE NEGATIVE Final    Comment: (NOTE) SARS-CoV-2 target nucleic acids are NOT DETECTED. The SARS-CoV-2 RNA is generally detectable in upper and lower respiratory specimens during the acute phase of infection. Negative results do not preclude SARS-CoV-2 infection, do not rule out co-infections with other pathogens, and should not be used as the sole basis for treatment or other patient management decisions. Negative results must be combined with clinical observations, patient history, and epidemiological information. The expected result is Negative. Fact Sheet for Patients: SugarRoll.be Fact Sheet for Healthcare Providers: https://www.woods-mathews.com/ This test is not yet approved or cleared by the [place] FDA and  has been authorized for detection and/or diagnosis of SARS-CoV-2 by FDA under an Emergency Use  Authorization (EUA). This EUA will remain  in effect (meaning this test can be used) for the duration of the COVID-19 declaration under Section 56 4(b)(1) of the Act, 21 U.S.C. section 360bbb-3(b)(1), unless the authorization is terminated or revoked sooner. Performed at [facility], [place] [address], [place], [place] [address]      Labs: BNP (last 3 results) No results for input(s): BNP in the last  8760 hours. Basic Metabolic Panel: Recent Labs  Lab 01/11/20 1634 01/12/20 0005  NA 130* 130*  K 5.5* 5.0  CL 96* 91*  CO2 24 21*  GLUCOSE 130* 146*  BUN 152* 154*  CREATININE 3.78* 3.78*  CALCIUM 8.3* 8.3*   Liver Function Tests: Recent Labs  Lab 01/11/20 1634 01/12/20 0005  AST 41 38  ALT 28 28  ALKPHOS 153* 147*  BILITOT 1.3* 1.4*  PROT 6.0* 5.7*  ALBUMIN 3.4* 3.2*   No results for input(s): LIPASE, AMYLASE in the last 168 hours. No results for input(s): AMMONIA in the last 168 hours. CBC: Recent Labs  Lab 01/11/20 1634 01/12/20 0005  WBC 17.6* 17.5*  NEUTROABS 15.9* 15.8*  HGB 10.3* 9.5*  HCT 32.3* 29.6*  MCV 83.7 84.3  PLT 297 271   Cardiac Enzymes: No results for input(s): CKTOTAL, CKMB, CKMBINDEX, TROPONINI in the last 168 hours. BNP: Invalid input(s): POCBNP CBG: Recent Labs  Lab 01/11/20 1602 01/11/20 2216 01/12/20 0618 01/12/20 1047  GLUCAP 139* 121* 143* 197*   D-Dimer No results for input(s): DDIMER in the last 72 hours. Hgb A1c No results for input(s): HGBA1C in the last 72 hours. Lipid Profile No results for input(s): CHOL, HDL, LDLCALC, TRIG, CHOLHDL, LDLDIRECT in the last 72 hours. Thyroid function studies No results for input(s): TSH, T4TOTAL, T3FREE, THYROIDAB in the last 72 hours.  Invalid input(s): FREET3 Anemia work up No results for input(s): VITAMINB12, FOLATE, FERRITIN, TIBC, IRON, RETICCTPCT in the last 72 hours. Urinalysis No results found for: COLORURINE, APPEARANCEUR, [place], [place], [place], [place], [place], [place], PROTEINUR, UROBILINOGEN, NITRITE, LEUKOCYTESUR Sepsis Labs Invalid input(s): PROCALCITONIN,  WBC,  LACTICIDVEN Microbiology Recent Results (from the past 240 hour(s))  SARS CORONAVIRUS 2 (TAT 6-24 HRS) Nasopharyngeal Nasopharyngeal Swab     Status: None   Collection Time: 01/11/20  8:05 PM   Specimen: Nasopharyngeal Swab  Result Value Ref Range Status   SARS Coronavirus 2 NEGATIVE NEGATIVE  Final    Comment: (NOTE) SARS-CoV-2 target nucleic acids are NOT DETECTED. The SARS-CoV-2 RNA is generally detectable in upper and lower respiratory specimens during the acute phase of infection. Negative results do not preclude SARS-CoV-2 infection, do not rule out co-infections with other pathogens, and should not be used as the sole basis for treatment or other patient management decisions. Negative results must be combined with clinical observations, patient history, and epidemiological information. The expected result is Negative. Fact Sheet for Patients: SugarRoll.be Fact Sheet for Healthcare Providers: https://www.woods-mathews.com/ This test is not yet approved or cleared by the [place] FDA and  has been authorized for detection and/or diagnosis of SARS-CoV-2 by FDA under an Emergency Use Authorization (EUA). This EUA will remain  in effect (meaning this test can be used) for the duration of the COVID-19 declaration under Section 56 4(b)(1) of the Act, 21 U.S.C. section 360bbb-3(b)(1), unless the authorization is terminated or revoked sooner. Performed at [facility], [place] [address], [place], [place] [address]      Time coordinating discharge: Over 40 minutes  SIGNED:   Bess Harvest  Olevia Bowens, MD  [facility] 01/12/2020, 11:13 AM Pager   If 7PM-7AM, please contact night-coverage [email or web address] Password [email or web address]

## 2020-01-12 NOTE — Progress Notes (Signed)
Received call from IV team about PICC line placement. D/T CrCl , PICC line has to be OK'd by nephrology. Will discuss with primary team this morning.

## 2020-01-12 NOTE — Progress Notes (Deleted)
PT Cancellation Note  Patient Details Name: DEREN DEGRAZIA MRN: [id number] DOB: 10-16-1941   Cancelled Treatment:    Reason Eval/Treat Not Completed: PT screened, no needs identified, will sign off. Per chart review patient now on comfort care and discharging with hospice care. PT will sign off at this time.   Zenaida Niece 01/12/2020, 2:56 PM

## 2020-01-12 NOTE — Progress Notes (Signed)
[facility] PROGRESS NOTE    Progress Note  Antonio Snyder  DUK:[id number] DOB: February 14, 1941 DOA: 01/11/2020 PCP: Cher Nakai, MD     Brief Narrative:   Antonio Snyder is an 79 y.o. male past medical history of CAD status post CABG, atrial fibrillation on aspirin, bilateral hip replacement transferred to [facility] from [place] after a fall and found to have multiple pubic fractures that was none-operative.  During his admission at [facility] he was found to have worsening edema an echo was done during that time did show severe TR he had ascites he underwent paracentesis and removed 4.5 L, cardiology was consulted and palliative care was consulted and patient was made DNR/DNI.  Patient requested transfer to higher level of care lower extremity Doppler was negative. On 12/11/2019 at that time he had worsening heart failure renal function he was in sinus rhythm he was taking his medication inappropriately.  At that time his creatinine was 3.1 which he has steadily been climbing for the last several months Assessment/Plan:   Bilateral pubic ramus fracture status post fall: Orthopedic surgery was consulted and recommended conservative management. Physical therapy was consulted.    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 Urine lites were ordered Urinary sodium is less than 10, his urine creatinine is 63.1. Nephrology recommended IV rate Lasix and Zaroxolyn, with poor urine out, pt is oliguric. Patient is significantly fluid overloaded on physical exam, patient has an extremely poor prognosis awaiting 2D echo. We will have to get hospice and palliative care involved to start moving towards hospice care. Family wants to come and visit him to talk to him about end-of-life goals of care. We will consult hospice and palliative care.  Paroxysmal atrial fibrillation: Rate control on amiodarone  and metoprolol. On no anticoagulation due to history of GI bleed, continue aspirin.  Hyperlipidemia: Continue Lipitor.  Diabetes mellitus type 2: Continue sliding scale insulin.  Present on admission stage I sacral decubitus ulcer: RN Pressure Injury Documentation: Pressure Injury 01/11/20 Buttocks Right;Left Stage 1 -  Intact skin with non-blanchable redness of a localized area usually over a bony prominence. (Active)  01/11/20 1802  Location: Buttocks  Location Orientation: Right;Left  Staging: Stage 1 -  Intact skin with non-blanchable redness of a localized area usually over a bony prominence.  Wound Description (Comments):   Present on Admission: Yes    Estimated body mass index is 37.05 kg/m as calculated from the following:   Height as of this encounter: 5\' 4"  (1.626 m).   Weight as of this encounter: 97.9 kg.    DVT prophylaxis: lovenox Family Communication:wife Disposition Plan/Barrier to D/C: hospice care, awiting family meeting PMT.  Code Status:     Code Status Orders  (From admission, onward)         Start     Ordered   01/11/20 1628  Do not attempt resuscitation (DNR)  Continuous    Question Answer Comment  In the event of cardiac or respiratory ARREST Do not call a "code blue"   In the event of cardiac or respiratory ARREST Do not perform Intubation, CPR, defibrillation or ACLS   In the event of cardiac or respiratory ARREST Use medication by any route, position, wound care, and other measures to relive pain and suffering. May use oxygen, suction and manual treatment of airway obstruction as needed for comfort.      01/11/20 1631  Code Status History    This patient has a current code status but no historical code status.   Advance Care Planning Activity        IV Access:    Peripheral IV   Procedures and diagnostic studies:   US RENAL  Result Date: 01/12/2020 CLINICAL DATA:  Chronic kidney disease. EXAM: RENAL / URINARY TRACT  ULTRASOUND COMPLETE COMPARISON:  01/06/2020 FINDINGS: Right Kidney: Renal measurements: 10.7 x 5.7 x 6.0 cm = volume: 191 mL . Echogenicity within normal limits. No mass or hydronephrosis visualized. Left Kidney: Renal measurements: 11.1 x 5.8 x 6.2 cm = volume: 208 mL. Echogenicity within normal limits. No mass or hydronephrosis visualized. Bladder: Decompressed by Foley catheter. Other: None. IMPRESSION: No evidence for hydronephrosis. Electronically Signed   By: Misty Stanley M.D.   On: 01/12/2020 04:56   DG CHEST PORT 1 VIEW  Result Date: 01/11/2020 CLINICAL DATA:  Short of breath, productive cough EXAM: PORTABLE CHEST 1 VIEW COMPARISON:  01/06/2020 FINDINGS: Single frontal view of the chest demonstrates persistent enlargement of the cardiac silhouette. Postsurgical changes from median sternotomy. There is chronic central vascular congestion, with persistent bibasilar consolidation. There is now superimposed ground-glass airspace disease at the lung bases. Trace bilateral pleural effusions. No pneumothorax. IMPRESSION: 1. Findings most consistent with mild congestive heart failure. Slight worsening of volume status since prior study. Electronically Signed   By: Randa Ngo M.D.   On: 01/11/2020 21:45     Medical Consultants:    None.  Anti-Infectives:   none  Subjective:    Antonio Snyder has no new complaints he relates his breathing is not improved.  Objective:    Vitals:   01/12/20 0020 01/12/20 0548 01/12/20 0549 01/12/20 0603  BP: (!) 114/55 (!) 97/51    Pulse: 64 62 61   Resp: 20 18    Temp: 97.8 F (36.6 C) 98.2 F (36.8 C)    TempSrc: Oral Oral    SpO2: 99% 93% 95%   Weight:    97.9 kg  Height:       SpO2: 95 % O2 Flow Rate (L/min): 4 L/min   Intake/Output Summary (Last 24 hours) at 01/12/2020 0714 Last data filed at 01/12/2020 0636 Gross per 24 hour  Intake 651.74 ml  Output 650 ml  Net 1.74 ml   Filed Weights   01/11/20 1528 01/12/20 0603  Weight: 97.1  kg 97.9 kg    Exam: General exam: In no acute distress. Respiratory system: Good air movement and crackles at bases bilaterally Cardiovascular system: S1 & S2 heard, RRR. +JVD Gastrointestinal system: Bowel sounds soft distended. Central nervous system: Alert and oriented. No focal neurological deficits. Extremities: 2+ edema Skin: No rashes, lesions or ulcers Psychiatry: Judgement and insight appear normal. Mood & affect appropriate.    Data Reviewed:    Labs: Basic Metabolic Panel: Recent Labs  Lab 01/11/20 1634 01/12/20 0005  NA 130* 130*  K 5.5* 5.0  CL 96* 91*  CO2 24 21*  GLUCOSE 130* 146*  BUN 152* 154*  CREATININE 3.78* 3.78*  CALCIUM 8.3* 8.3*   GFR Estimated Creatinine Clearance: 17.6 mL/min (A) (by C-G formula based on SCr of 3.78 mg/dL (H)). Liver Function Tests: Recent Labs  Lab 01/11/20 1634  AST 41  ALT 28  ALKPHOS 153*  BILITOT 1.3*  PROT 6.0*  ALBUMIN 3.4*   No results for input(s): LIPASE, AMYLASE in the last 168 hours. No results for input(s): AMMONIA in the last 168 hours. Coagulation  profile No results for input(s): INR, PROTIME in the last 168 hours. COVID-19 Labs  No results for input(s): DDIMER, FERRITIN, LDH, CRP in the last 72 hours.  Lab Results  Component Value Date   [place] NEGATIVE 01/11/2020    CBC: Recent Labs  Lab 01/11/20 1634 01/12/20 0005  WBC 17.6* 17.5*  NEUTROABS 15.9* 15.8*  HGB 10.3* 9.5*  HCT 32.3* 29.6*  MCV 83.7 84.3  PLT 297 271   Cardiac Enzymes: No results for input(s): CKTOTAL, CKMB, CKMBINDEX, TROPONINI in the last 168 hours. BNP (last 3 results) Recent Labs    11/13/19 1444 11/20/19 1028 12/11/19 0000  PROBNP 2,848* 3,352* 6,279*   CBG: Recent Labs  Lab 01/11/20 1602 01/11/20 2216 01/12/20 0618  GLUCAP 139* 121* 143*   D-Dimer: No results for input(s): DDIMER in the last 72 hours. Hgb A1c: No results for input(s): HGBA1C in the last 72 hours. Lipid Profile: No results  for input(s): CHOL, HDL, LDLCALC, TRIG, CHOLHDL, LDLDIRECT in the last 72 hours. Thyroid function studies: No results for input(s): TSH, T4TOTAL, T3FREE, THYROIDAB in the last 72 hours.  Invalid input(s): FREET3 Anemia work up: No results for input(s): VITAMINB12, FOLATE, FERRITIN, TIBC, IRON, RETICCTPCT in the last 72 hours. Sepsis Labs: Recent Labs  Lab 01/11/20 1634 01/12/20 0005  WBC 17.6* 17.5*   Microbiology Recent Results (from the past 240 hour(s))  SARS CORONAVIRUS 2 (TAT 6-24 HRS) Nasopharyngeal Nasopharyngeal Swab     Status: None   Collection Time: 01/11/20  8:05 PM   Specimen: Nasopharyngeal Swab  Result Value Ref Range Status   SARS Coronavirus 2 NEGATIVE NEGATIVE Final    Comment: (NOTE) SARS-CoV-2 target nucleic acids are NOT DETECTED. The SARS-CoV-2 RNA is generally detectable in upper and lower respiratory specimens during the acute phase of infection. Negative results do not preclude SARS-CoV-2 infection, do not rule out co-infections with other pathogens, and should not be used as the sole basis for treatment or other patient management decisions. Negative results must be combined with clinical observations, patient history, and epidemiological information. The expected result is Negative. Fact Sheet for Patients: SugarRoll.be Fact Sheet for Healthcare Providers: https://www.woods-mathews.com/ This test is not yet approved or cleared by the [place] FDA and  has been authorized for detection and/or diagnosis of SARS-CoV-2 by FDA under an Emergency Use Authorization (EUA). This EUA will remain  in effect (meaning this test can be used) for the duration of the COVID-19 declaration under Section 56 4(b)(1) of the Act, 21 U.S.C. section 360bbb-3(b)(1), unless the authorization is terminated or revoked sooner. Performed at [facility], [place] [address] [place], [place] [address]      Medications:   .  amiodarone  200 mg Oral Daily  . aspirin EC  81 mg Oral Daily  . atorvastatin  40 mg Oral QHS  . Chlorhexidine Gluconate Cloth  6 each Topical Daily  . [START ON 01/13/2020] influenza vaccine adjuvanted  0.5 mL Intramuscular Tomorrow-1000  . insulin aspart  0-9 Units Subcutaneous TID WC  . metolazone  5 mg Oral Daily  . metoprolol tartrate  50 mg Oral QHS  . pantoprazole  40 mg Oral BID  . sodium chloride flush  3 mL Intravenous Q12H   Continuous Infusions: . sodium chloride Stopped (01/12/20 0505)  . furosemide 60 mL/hr at 01/12/20 0636      LOS: 1 day   Charlynne Cousins  [facility]  01/12/2020, 7:14 AM

## 2020-01-12 NOTE — Consult Note (Addendum)
Consultation Note Date: 01/12/2020   Patient Name: Antonio Snyder  DOB: 19-Jan-1941  MRN: [id number]  Age / Sex: 79 y.o., male  PCP: Cher Nakai, MD Referring Physician: Aileen Fass, Tammi Klippel, MD  Reason for Consultation: Establishing goals of care  HPI/Patient Profile:  Antonio Snyder is an 79 y.o. male past medical history of CAD status post CABG, atrial fibrillation on aspirin, bilateral hip replacement transferred to [facility] from [place] after a fall and found to have multiple pubic fractures that was none-operative.  During his admission at [facility] he was found to have worsening edema an echo was done during that time did show severe TR he had ascites he underwent paracentesis and removed 4.5 L, cardiology was consulted and palliative care was consulted and patient was made DNR/DNI.  Patient requested transfer to higher level of care lower extremity Doppler was negative. On 12/11/2019 at that time he had worsening heart failure renal function he was in sinus rhythm he was taking his medication inappropriately.  At that time his creatinine was 3.1 which he has steadily been climbing for the last several months.  Palliative care was asked to help with goals of care conversations in the setting of cardiorenal syndrome with renal failure.  Clinical Assessment and Goals of Care: I have reviewed medical records including EPIC notes, labs and imaging, received report from bedside RN, assessed the patient.    I met with Antonio Snyder to further discuss diagnosis prognosis, GOC, EOL wishes, disposition and options.   I introduced Palliative Medicine as specialized medical care for people living with serious illness. It focuses on providing relief from the symptoms and stress of a serious illness. The goal is to improve quality of life for both the patient and the family.  I asked Antonio Snyder if he could tell me a little  about himself.He shares that he is from [place]. He is married to his wife, Pamala Hurry. They have been married for > fifty six years. They share three children together. 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   Antonio Snyder has no been able to preform his bADL's on his own for some time. He reliant upon his wife to help with things like dressing, bathing, cooking , though he is able to feed himself and mobilize with help.    A detailed discussion was had today regarding concepts specific to code status, artifical feeding and hydration, continued IV antibiotics and rehospitalization was had.  Antonio Snyder shared that he knows his health is failing. He said that he would not want CPR, intubation, feeding tubes, or hemodialysis. He would just want to "cut it off" if it came down to it. I asked him what he meant by "cut it off" and we talked about how he would not want his life prolonged. He said that he is "  79 years old" he would want to be "let go".   The difference between an aggressive medical intervention path and a palliative comfort care path for this patient at this time was had. Values and goals of care important to patient and family were attempted to be elicited. The topic of hospice was introduced which Antonio Snyder was in agreement with. He said that he would wish to go home and have his needs met there around his family. He would wish to be comfortable at this point in time. All invasive measures will be stopped.   I called his wife, Pamala Hurry to share the things Dail and I had talked about. She was in complete agreement with this plan. She said that she and her family had already discussed him coming home on hospice and everyone felt this was the most appropriate next step.   Discussed with patient the importance  of continued conversation with family and their  medical providers regarding overall plan of care and treatment options, ensuring decisions are within the context of the patients values and GOCs.  SUMMARY OF RECOMMENDATIONS   DNAR/DNI, No feeding tube, No dialysis, No life prolonging measures  Comfort Care  TOC --> Home hospice referral  Code Status/Advance Care Planning:  DNR  Symptom Management:  Dyspnea:  - Dilaudid 0.5-56m IV Q1H PRN Pain:  - Tylenol 6560mPO/PR Q6H PRN  - Heat Pack PRN Fever:  - Tylenol as above Agitation: Anxiety:  - Lorazepam 0.5-33m54mIV  Q1H PRN Nausea:  - Zofran 4mg75m/IV Q6H PRN  Secretions:  - Glycopyrrolate 0.4mg 24mQ4H PRN Dry Eyes:  - Artificial Tears PRN Xerostomia:  - Biotene 15ml 333m - BID oral care Urinary Retention:  - Bladder scan if > 300ml p62m foley Constipation:  - Bisacodyl 10mg PR52m QDay  Palliative Prophylaxis:   Aspiration, Bowel Regimen, Delirium Protocol, Eye Care, Frequent Pain Assessment, Oral Care, Palliative Wound Care and Turn Reposition  Additional Recommendations (Limitations, Scope, Preferences):  Avoid Hospitalization and No Artificial Feeding No HD  Psycho-social/Spiritual:   Desire for further Chaplaincy support: No  Additional Recommendations: Caregiving  Support/Resources, Education on Hospice and Grief/Bereavement Support  Prognosis:   Hours - Days  Discharge Planning: Home with Hospice     Primary Diagnoses: Present on Admission: . Cardiorenal syndrome with renal failure . Bilateral pubic rami fractures, open, initial encounter (HCC) . CCeibanary artery disease involving native coronary artery of native heart with angina pectoris (HCC) . DSikesipidemia . Hyperlipidemia . PAF (paroxysmal atrial fibrillation) (HCC) . [facility] [place] [facility] chronic combined systolic and diastolic CHF (congestive heart failure) (HCC)  I Mercere reviewed the medical record, interviewed the patient and family, and examined the  patient. The following aspects are pertinent. Past Medical History:  Diagnosis Date  . AKI (acute kidney injury) (HCC) 1/5[place]18  . Atrial flutter (HCC) 1/4[place]18  . CAD in native artery 04/19/2016   03/2008 CABG LIMA to LAD, SVG DIAG, OM1, OM2  . Chronic diastolic heart failure (HCC) 6/6[place]17  . COPD (chronic obstructive pulmonary disease) (HCC) 9/1[place]018  . DM (diabetes mellitus) (HCC) 9/1[place]018  . Dyslipidemia 01/29/2018  . Essential hypertension 04/19/2016  . 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 Hyperlipidemia 04/19/2016  . Hypertensive heart disease with heart failure (HCC) 6/6[place]17  . Hypoxemia 11/21/2016  . Lactic acidosis 08/20/2016  . Left upper lobe pneumonia 08/20/2016  . On amiodarone therapy 04/19/2016  . PAF (paroxysmal atrial fibrillation) (HCC) 6/6[place]17   Social History  Socioeconomic History  . Marital status: Married    Spouse name: Not on file  . Number of children: Not on file  . Years of education: Not on file  . Highest education level: Not on file  Occupational History  . Not on file  Tobacco Use  . Smoking status: Never Smoker  . Smokeless tobacco: Never Used  Substance and Sexual Activity  . Alcohol use: Never  . Drug use: Never  . Sexual activity: Not on file  Other Topics Concern  . Not on file  Social History Narrative  . Not on file   Social Determinants of Health   Financial Resource Strain:   . Difficulty of Paying Living Expenses: Not on file  Food Insecurity:   . Worried About Charity fundraiser in the Last Year: Not on file  . Ran Out of Food in the Last Year: Not on file  Transportation Needs:   . Lack of Transportation (Medical): Not on file  . Lack of Transportation (Non-Medical): Not on file  Physical Activity:   . Days of Exercise per Week: Not on file  . Minutes of Exercise per Session: Not on file  Stress:   . Feeling of Stress : Not on file  Social Connections:   . Frequency of Communication with Friends and Family: Not on  file  . Frequency of Social Gatherings with Friends and Family: Not on file  . Attends Religious Services: Not on file  . Active Member of Clubs or Organizations: Not on file  . Attends Archivist Meetings: Not on file  . Marital Status: Not on file   Family History  Problem Relation Age of Onset  . Congestive Heart Failure Mother   . Heart attack Father    Scheduled Meds: . amiodarone  200 mg Oral Daily  . aspirin EC  81 mg Oral Daily  . atorvastatin  40 mg Oral QHS  . Chlorhexidine Gluconate Cloth  6 each Topical Daily  . [START ON 01/13/2020] influenza vaccine adjuvanted  0.5 mL Intramuscular Tomorrow-1000  . insulin aspart  0-9 Units Subcutaneous TID WC  . metolazone  5 mg Oral Daily  . metoprolol tartrate  50 mg Oral QHS  . pantoprazole  40 mg Oral BID  . sodium chloride flush  3 mL Intravenous Q12H   Continuous Infusions: . sodium chloride Stopped (01/12/20 0505)  . furosemide 60 mL/hr at 01/12/20 0636   PRN Meds:.sodium chloride, acetaminophen **OR** acetaminophen, calcium carbonate (dosed in mg elemental calcium), camphor-menthol **AND** hydrOXYzine, docusate sodium, feeding supplement (NEPRO CARB STEADY), guaiFENesin-dextromethorphan, ondansetron **OR** ondansetron (ZOFRAN) IV, sodium chloride flush, sorbitol, zolpidem Medications Prior to Admission:  Prior to Admission medications   Medication Sig Start Date End Date Taking? Authorizing Provider  amiodarone (PACERONE) 200 MG tablet TAKE 1 TABLET (200 MG TOTAL) BY MOUTH DAILY. 08/20/19  Yes Richardo Priest, MD  Ascorbic Acid (VITAMIN C) 1000 MG tablet Take 1,000 mg by mouth daily.   Yes [provider]  aspirin EC 81 MG tablet Take 81 mg by mouth at bedtime.    Yes [provider]  atorvastatin (LIPITOR) 40 MG tablet TAKE 1 TABLET (40 MG TOTAL) BY MOUTH DAILY. Patient taking differently: Take 40 mg by mouth at bedtime.  11/21/19  Yes Richardo Priest, MD  glimepiride (AMARYL) 4 MG tablet Take 4 mg  by mouth daily with breakfast.   Yes [provider]  metoprolol tartrate (LOPRESSOR) 100 MG tablet TAKE 1/2 TABLET EVERY  DAY Patient taking differently: Take 50 mg by mouth at bedtime.  01/01/20  Yes Richardo Priest, MD  neomycin-bacitracin-polymyxin (NEOSPORIN) ointment Apply 1 application topically every other day. Apply to leg wounds   Yes [provider]  pantoprazole (PROTONIX) 40 MG tablet Take 40 mg by mouth 2 (two) times daily.    Yes [provider]  sitaGLIPtin (JANUVIA) 50 MG tablet Take 50 mg by mouth daily.   Yes [provider]  torsemide (DEMADEX) 20 MG tablet TAKE 2 tablets 65m twice daily Patient taking differently: Take 40 mg by mouth 2 (two) times daily.  11/13/19  Yes MRichardo Priest MD  VITAMIN K PO Take 5,000 Units by mouth daily at 2 PM.   Yes [provider]   No Known Allergies Review of Systems  Constitutional: Positive for activity change, appetite change, fatigue and unexpected weight change.  Respiratory: Positive for shortness of breath.   Neurological: Positive for weakness.   Physical Exam Vitals and nursing note reviewed.  HENT:     Head: Normocephalic.     Nose: Nose normal.     Mouth/Throat:     Mouth: Mucous membranes are dry.  Eyes:     Pupils: Pupils are equal, round, and reactive to light.  Pulmonary:     Comments: Labored on supplemental O2, diffuse crackles Abdominal:     General: There is distension.     Palpations: Abdomen is soft.  Musculoskeletal:     Cervical back: Normal range of motion.  Skin:    General: Skin is dry.     Capillary Refill: Capillary refill takes less than 2 seconds.     Coloration: Skin is pale.  Neurological:     Mental Status: He is alert and oriented to person, place, and time.  Psychiatric:        Thought Content: Thought content normal.    Vital Signs: BP (!) 94/50 (BP Location: Left Arm)   Pulse (!) 58   Temp 97.8 F (36.6 C) (Oral)   Resp 14   Ht 5' 4"   (1.626 m)   Wt 97.9 kg   SpO2 96%   BMI 37.05 kg/m  Pain Scale: 0-10   Pain Score: 0-No pain  SpO2: SpO2: 96 % O2 Device:SpO2: 96 % O2 Flow Rate: .O2 Flow Rate (L/min): 3 L/min  IO: Intake/output summary:   Intake/Output Summary (Last 24 hours) at 01/12/2020 1039 Last data filed at 01/12/2020 1000 Gross per 24 hour  Intake 930.86 ml  Output 750 ml  Net 180.86 ml   LBM: Last BM Date: 01/11/20 Baseline Weight: Weight: 97.1 kg Most recent weight: Weight: 97.9 kg     Palliative Assessment/Data: 20%  Time In: 0900 Time Out: 1010 Time Total: 70 Greater than 50%  of this time was spent counseling and coordinating care related to the above assessment and plan.  Signed by: MRosezella Rumpf NP   Please contact Palliative Medicine Team phone at [phone] questions and concerns.  For individual provider: See AShea Evans

## 2020-01-12 NOTE — Progress Notes (Signed)
  Echocardiogram 2D Echocardiogram has been performed.  Antonio Snyder 01/12/2020, 9:04 AM

## 2020-01-12 NOTE — Plan of Care (Signed)
  Problem: Education: Goal: Knowledge of disease and its progression will improve Outcome: Progressing   Problem: Health Behavior/Discharge Planning: Goal: Ability to manage health-related needs will improve Outcome: Progressing   Problem: Fluid Volume: Goal: Compliance with measures to maintain balanced fluid volume will improve Outcome: Progressing

## 2020-01-12 NOTE — Progress Notes (Signed)
Dr Jonnie Finner states PICC placement is approved to be placed in pt dominant arm.  Please place PICC order if still desired.

## 2020-01-12 NOTE — Progress Notes (Signed)
Progress Note  Patient Name: Antonio Snyder Date of Encounter: 01/12/2020  Primary Cardiologist: Shirlee More, MD   Subjective   Denies CP   Breathing is fair   "Can my heart be fixed?"  Inpatient Medications    Scheduled Meds:  amiodarone  200 mg Oral Daily   aspirin EC  81 mg Oral Daily   atorvastatin  40 mg Oral QHS   Chlorhexidine Gluconate Cloth  6 each Topical Daily   [START ON 01/13/2020] influenza vaccine adjuvanted  0.5 mL Intramuscular Tomorrow-1000   insulin aspart  0-9 Units Subcutaneous TID WC   metolazone  5 mg Oral Daily   metoprolol tartrate  50 mg Oral QHS   pantoprazole  40 mg Oral BID   sodium chloride flush  3 mL Intravenous Q12H   Continuous Infusions:  sodium chloride 10 mL/hr at 01/12/20 7425   furosemide 100 mg (01/12/20 0506)   PRN Meds: sodium chloride, acetaminophen **OR** acetaminophen, calcium carbonate (dosed in mg elemental calcium), camphor-menthol **AND** hydrOXYzine, docusate sodium, feeding supplement (NEPRO CARB STEADY), guaiFENesin-dextromethorphan, ondansetron **OR** ondansetron (ZOFRAN) IV, sodium chloride flush, sorbitol, zolpidem   Vital Signs    Vitals:   01/11/20 2011 01/12/20 0020 01/12/20 0548 01/12/20 0549  BP: (!) 112/53 (!) 114/55 (!) 97/51   Pulse: 68 64 62 61  Resp: 18 20 18    Temp: 98.6 F (37 C) 97.8 F (36.6 C) 98.2 F (36.8 C)   TempSrc: Oral Oral Oral   SpO2: 97% 99% 93% 95%  Weight:      Height:        Intake/Output Summary (Last 24 hours) at 01/12/2020 0600 Last data filed at 01/12/2020 0425 Gross per 24 hour  Intake 580.66 ml  Output 325 ml  Net 255.66 ml   Last 3 Weights 01/11/2020 12/11/2019 11/20/2019  Weight (lbs) 214 lb 1.1 oz 218 lb 209 lb  Weight (kg) 97.1 kg 98.884 kg 94.802 kg      Telemetry    SR  - Personally Reviewed  ECG    Not done  - Personally Reviewed  Physical Exam   GEN: Obese 79 yo in no acute distress.   Neck: Neck is full    Cardiac: Distant   RRR  , no  murmurs, Respiratory: Clear to auscultation anteriorly   GI: Soft,distended  MS: 2+ edema; No deformity.  Feet warm   Neuro:  Pt is awake and alert today  Psych: Normal affect   Labs    High Sensitivity Troponin:  No results for input(s): TROPONINIHS in the last 720 hours.    Chemistry Recent Labs  Lab 01/11/20 1634 01/12/20 0005  NA 130* 130*  K 5.5* 5.0  CL 96* 91*  CO2 24 21*  GLUCOSE 130* 146*  BUN 152* 154*  CREATININE 3.78* 3.78*  CALCIUM 8.3* 8.3*  PROT 6.0*  --   ALBUMIN 3.4*  --   AST 41  --   ALT 28  --   ALKPHOS 153*  --   BILITOT 1.3*  --   GFRNONAA 15* 15*  GFRAA 17* 17*  ANIONGAP 10 18*     Hematology Recent Labs  Lab 01/11/20 1634 01/12/20 0005  WBC 17.6* 17.5*  RBC 3.86* 3.51*  HGB 10.3* 9.5*  HCT 32.3* 29.6*  MCV 83.7 84.3  MCH 26.7 27.1  MCHC 31.9 32.1  RDW 22.2* 22.5*  PLT 297 271    BNPNo results for input(s): BNP, PROBNP in the last 168 hours.   DDimer  No results for input(s): DDIMER in the last 168 hours.   Radiology    US RENAL  Result Date: 01/12/2020 CLINICAL DATA:  Chronic kidney disease. EXAM: RENAL / URINARY TRACT ULTRASOUND COMPLETE COMPARISON:  01/06/2020 FINDINGS: Right Kidney: Renal measurements: 10.7 x 5.7 x 6.0 cm = volume: 191 mL . Echogenicity within normal limits. No mass or hydronephrosis visualized. Left Kidney: Renal measurements: 11.1 x 5.8 x 6.2 cm = volume: 208 mL. Echogenicity within normal limits. No mass or hydronephrosis visualized. Bladder: Decompressed by Foley catheter. Other: None. IMPRESSION: No evidence for hydronephrosis. Electronically Signed   By: Misty Stanley M.D.   On: 01/12/2020 04:56   DG CHEST PORT 1 VIEW  Result Date: 01/11/2020 CLINICAL DATA:  Short of breath, productive cough EXAM: PORTABLE CHEST 1 VIEW COMPARISON:  01/06/2020 FINDINGS: Single frontal view of the chest demonstrates persistent enlargement of the cardiac silhouette. Postsurgical changes from median sternotomy. There is  chronic central vascular congestion, with persistent bibasilar consolidation. There is now superimposed ground-glass airspace disease at the lung bases. Trace bilateral pleural effusions. No pneumothorax. IMPRESSION: 1. Findings most consistent with mild congestive heart failure. Slight worsening of volume status since prior study. Electronically Signed   By: Randa Ngo M.D.   On: 01/11/2020 21:45    Cardiac Studies   Echo ordered  Patient Profile     Antonio Snyder is a 79 y.o. male with a hx of  CAD s/p CABG, a fib on ASA, CHF, DM, COPD CKD-3 and bilateral hip replacements and transferred to [facility] from [place] admitted after a fall  He was transferred to [facility] for continued care   Cardiology asked to see him today for the evaluation of severe TR and anasarca, at the request of Dr. Eliseo Squires.  Assessment & Plan    1  CHF  Pt with marked volume increase  Did not diurese signif with lasix   RV is dilated, function down   There is evid of RV volume overload  TR is at least moderate   LV is smaller  with probably normal function though window is poor  Reviewed with palliative service.   ANy eval/intervention would carry a risk  Pt expressed that he does not want dialysis   Wants to pursue comfort measures   I would Keep on ec ASA and metoprolol  Would split as 25 bid (Hx PAF)  Follow BP  May need to back off.     2  Hx severe TR  Echo today TR is at least moderate   3  Hx CAD with remote CABG  No active ischemia    4  HTN  SBP 90s to 100s  Adjust meds as notedd   5  Afib   PAF  Remains in SR    6  Renal  BUN / CR 154/3.78     Poor perfusion  Did not respond to diuretic challenge  Renal may recomm outpt Rx   OK to dc from cardiac standpoint to home as do not have more to offer  For questions or updates, please contact [facility] Please consult [email or web address] for contact info under        Signed, Dorris Carnes, MD  01/12/2020, 6:00 AM

## 2020-01-12 NOTE — TOC Transition Note (Addendum)
Transition of Care [facility]) - CM/SW Discharge Note   Patient Details  Name: Antonio Snyder MRN: [id number] Date of Birth: 1941-03-15  [facility] CM/SW Contact:  Carles Collet, RN Phone Number: 01/12/2020, 2:52 PM   Clinical Narrative:    Damaris Schooner w patient's wife and daughter. They were provided choice by Palliative team and chose [facility] Referral was accepted. [facility] working on getting hospital bed oxygen etc set up in the home today. When speaking to his daughter I asked that she call me back when the equipment was delivered so that we will know when to call [facility].  Address and phone numbers verified.  [facility] forms will be on the chart.  Family called and said that equipment is at the house. [facility] called. Hospice rep and bedside nurse notified that [facility] called.      Final next level of care: Home w Hospice Care Barriers to Discharge: No Barriers Identified   Patient Goals and CMS Choice Patient states their goals for this hospitalization and ongoing recovery are:: to go home CMS Medicare.gov Compare Post Acute Care list provided to:: Other (Comment Required) Choice offered to / list presented to : Adult Children, Spouse  Discharge Placement                       Discharge Plan and Services                            HH Agency: [facility]) Date HH Agency Contacted: 01/12/20 Time [place]: 1452 Representative spoke with at [place]: [place] ([place]) Interventions     Readmission Risk Interventions No flowsheet data found.

## 2020-01-28 ENCOUNTER — Other Ambulatory Visit: Payer: Medicare HMO

## 2020-02-13 DEATH — deceased

## 2020-09-01 IMAGING — DX DG CHEST 1V PORT
1 series · 1 of 1 positions shown · non-contrast
Comparison: 01/06/2020

CLINICAL DATA: Short of breath, productive cough

EXAM:
PORTABLE CHEST 1 VIEW

[chest]
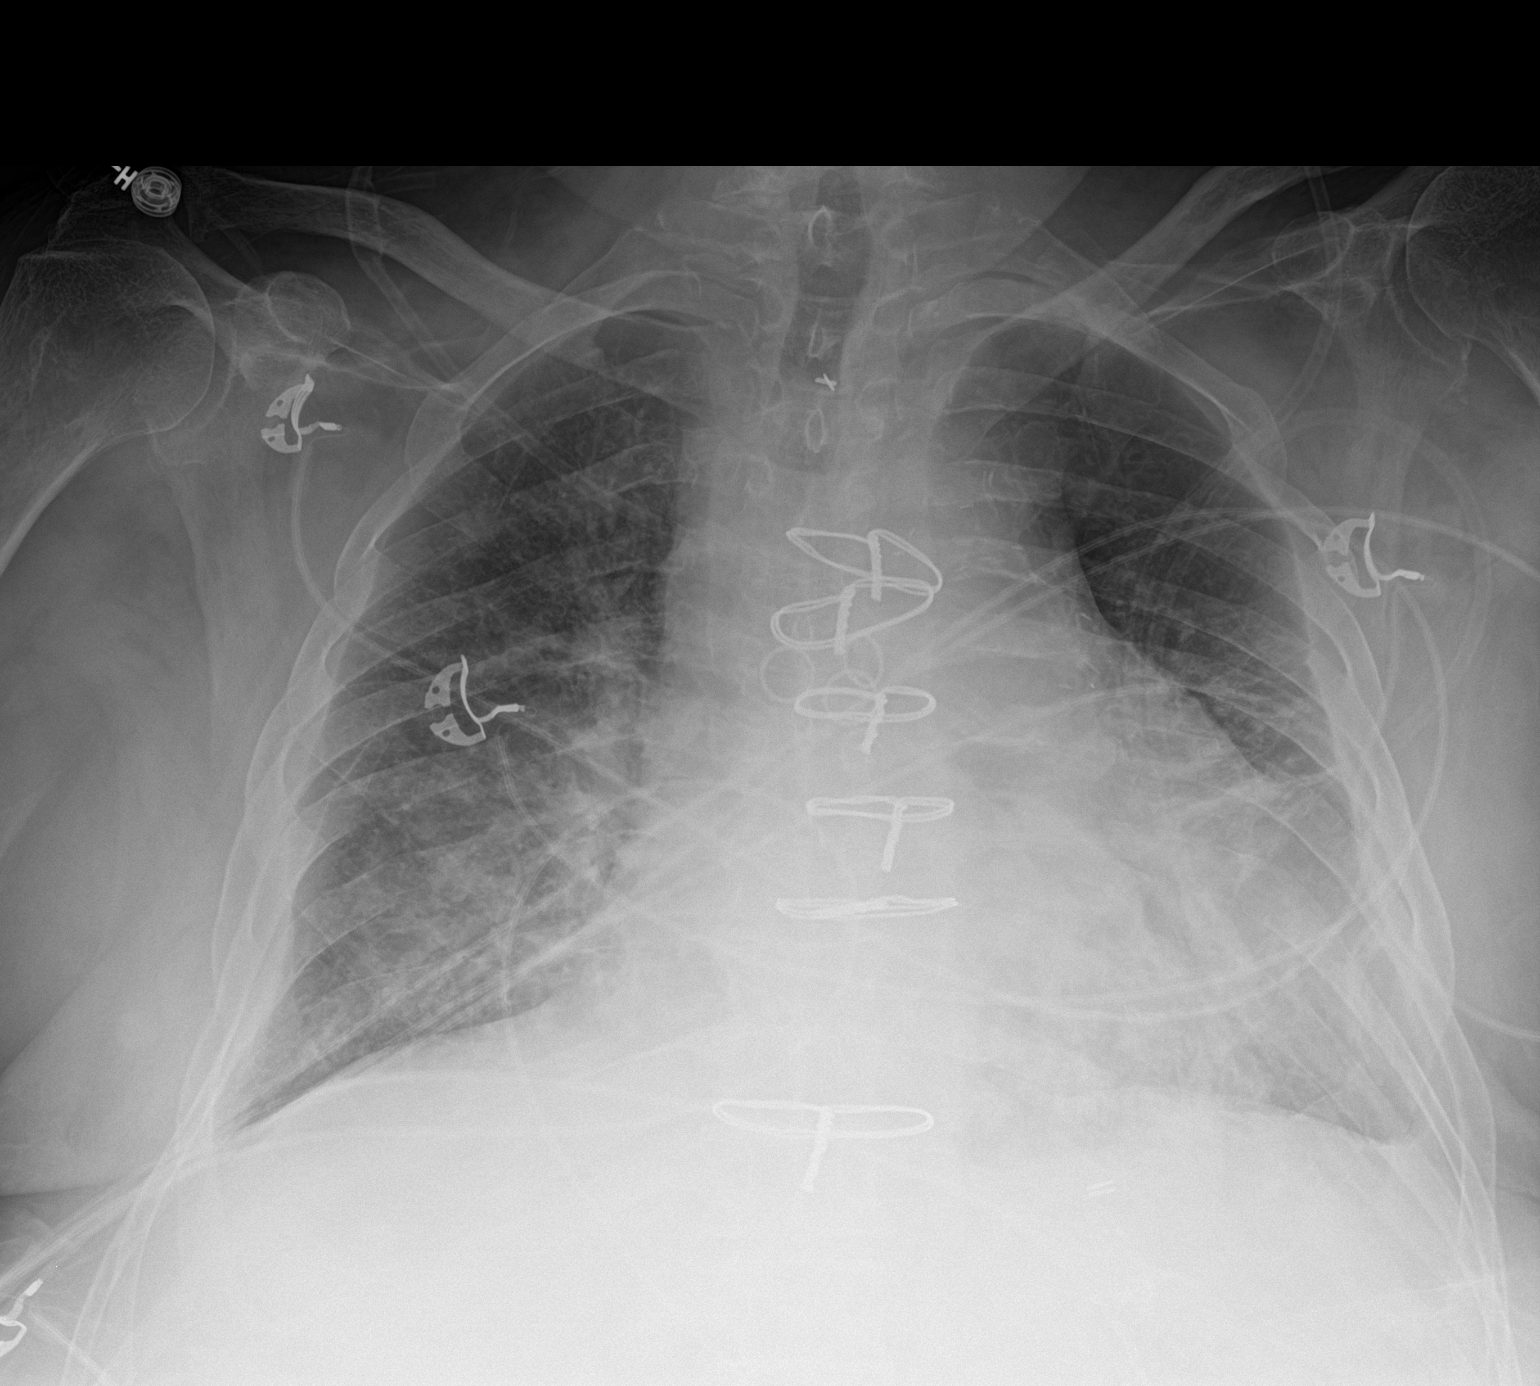

[1 of 1 positions shown; findings below may reference images not displayed]

FINDINGS: Single frontal view of the chest demonstrates persistent enlargement
of the cardiac silhouette. Postsurgical changes from median
sternotomy. There is chronic central vascular congestion, with
persistent bibasilar consolidation. There is now superimposed
ground-glass airspace disease at the lung bases. Trace bilateral
pleural effusions. No pneumothorax.
IMPRESSION: 1. Findings most consistent with mild congestive heart failure.
Slight worsening of volume status since prior study.
